# Patient Record
Sex: Male | Born: 1945 | Race: White | Hispanic: No | Marital: Married
Health system: Southern US, Community
[De-identification: ages and names within clinical notes are randomized; demographics above are authoritative.]

## PROBLEM LIST (undated history)

## (undated) DIAGNOSIS — F0390 Unspecified dementia without behavioral disturbance: Secondary | ICD-10-CM

## (undated) DIAGNOSIS — G4733 Obstructive sleep apnea (adult) (pediatric): Secondary | ICD-10-CM

## (undated) DIAGNOSIS — I10 Essential (primary) hypertension: Secondary | ICD-10-CM

## (undated) DIAGNOSIS — I509 Heart failure, unspecified: Secondary | ICD-10-CM

## (undated) DIAGNOSIS — E119 Type 2 diabetes mellitus without complications: Secondary | ICD-10-CM

## (undated) DIAGNOSIS — I2510 Atherosclerotic heart disease of native coronary artery without angina pectoris: Secondary | ICD-10-CM

## (undated) DIAGNOSIS — I5189 Other ill-defined heart diseases: Secondary | ICD-10-CM

## (undated) HISTORY — DX: Unspecified dementia, unspecified severity, without behavioral disturbance, psychotic disturbance, mood disturbance, and anxiety: F03.90

## (undated) HISTORY — DX: Essential (primary) hypertension: I10

## (undated) HISTORY — DX: Other ill-defined heart diseases: I51.89

## (undated) HISTORY — DX: Obstructive sleep apnea (adult) (pediatric): G47.33

## (undated) HISTORY — DX: Atherosclerotic heart disease of native coronary artery without angina pectoris: I25.10

---

## 2012-07-08 DIAGNOSIS — E782 Mixed hyperlipidemia: Secondary | ICD-10-CM | POA: Insufficient documentation

## 2012-07-08 DIAGNOSIS — R079 Chest pain, unspecified: Secondary | ICD-10-CM | POA: Insufficient documentation

## 2012-07-08 DIAGNOSIS — I259 Chronic ischemic heart disease, unspecified: Secondary | ICD-10-CM | POA: Insufficient documentation

## 2012-07-08 DIAGNOSIS — M79609 Pain in unspecified limb: Secondary | ICD-10-CM | POA: Insufficient documentation

## 2012-07-08 DIAGNOSIS — I6529 Occlusion and stenosis of unspecified carotid artery: Secondary | ICD-10-CM | POA: Insufficient documentation

## 2012-07-08 DIAGNOSIS — E669 Obesity, unspecified: Secondary | ICD-10-CM | POA: Insufficient documentation

## 2012-07-08 DIAGNOSIS — I201 Angina pectoris with documented spasm: Secondary | ICD-10-CM | POA: Insufficient documentation

## 2012-12-02 DIAGNOSIS — Z4889 Encounter for other specified surgical aftercare: Secondary | ICD-10-CM | POA: Insufficient documentation

## 2014-09-30 ENCOUNTER — Emergency Department: Payer: Self-pay | Admitting: Emergency Medicine

## 2014-09-30 LAB — COMPREHENSIVE METABOLIC PANEL
ALK PHOS: 89 U/L
Albumin: 3.8 g/dL (ref 3.4–5.0)
Anion Gap: 13 (ref 7–16)
BUN: 19 mg/dL — ABNORMAL HIGH (ref 7–18)
Bilirubin,Total: 0.6 mg/dL (ref 0.2–1.0)
CHLORIDE: 103 mmol/L (ref 98–107)
CREATININE: 1.15 mg/dL (ref 0.60–1.30)
Calcium, Total: 9 mg/dL (ref 8.5–10.1)
Co2: 22 mmol/L (ref 21–32)
EGFR (African American): >60
GLUCOSE: 111 mg/dL — AB (ref 65–99)
OSMOLALITY: 279 (ref 275–301)
Potassium: 4.2 mmol/L (ref 3.5–5.1)
SGOT(AST): 54 U/L — ABNORMAL HIGH (ref 15–37)
SGPT (ALT): 46 U/L
SODIUM: 138 mmol/L (ref 136–145)
Total Protein: 7.4 g/dL (ref 6.4–8.2)

## 2014-09-30 LAB — DIFFERENTIAL
BASOS ABS: 0.1 10*3/uL (ref 0.0–0.1)
BASOS PCT: 0.5 %
EOS ABS: 0 10*3/uL (ref 0.0–0.7)
EOS PCT: 0.1 %
LYMPHS PCT: 7.5 %
Lymphocyte #: 1.3 10*3/uL (ref 1.0–3.6)
MONOS PCT: 7.3 %
Monocyte #: 1.3 x10 3/mm — ABNORMAL HIGH (ref 0.2–1.0)
NEUTROS ABS: 14.8 10*3/uL — AB (ref 1.4–6.5)
Neutrophil %: 84.6 %

## 2014-09-30 LAB — CBC
HCT: 47.2 % (ref 40.0–52.0)
HGB: 15.4 g/dL (ref 13.0–18.0)
MCH: 30.8 pg (ref 26.0–34.0)
MCHC: 32.6 g/dL (ref 32.0–36.0)
MCV: 94 fL (ref 80–100)
PLATELETS: 272 10*3/uL (ref 150–440)
RBC: 5 10*6/uL (ref 4.40–5.90)
RDW: 12.5 % (ref 11.5–14.5)
WBC: 17.5 10*3/uL — AB (ref 3.8–10.6)

## 2014-09-30 LAB — PROTIME-INR
INR: 0.9
Prothrombin Time: 12.5 secs (ref 11.5–14.7)

## 2014-09-30 LAB — ETHANOL

## 2014-09-30 LAB — TROPONIN I: TROPONIN-I: 0.04 ng/mL

## 2014-10-02 ENCOUNTER — Emergency Department: Payer: Self-pay | Admitting: Emergency Medicine

## 2014-10-02 LAB — CBC
HCT: 45.1 % (ref 40.0–52.0)
HGB: 14.9 g/dL (ref 13.0–18.0)
MCH: 31.3 pg (ref 26.0–34.0)
MCHC: 33 g/dL (ref 32.0–36.0)
MCV: 95 fL (ref 80–100)
PLATELETS: 234 10*3/uL (ref 150–440)
RBC: 4.75 10*6/uL (ref 4.40–5.90)
RDW: 12.8 % (ref 11.5–14.5)
WBC: 5.7 10*3/uL (ref 3.8–10.6)

## 2014-10-02 LAB — ACETAMINOPHEN LEVEL: Acetaminophen: <2 ug/mL

## 2014-10-02 LAB — COMPREHENSIVE METABOLIC PANEL
ANION GAP: 11 (ref 7–16)
AST: 50 U/L — AB (ref 15–37)
Albumin: 3.4 g/dL (ref 3.4–5.0)
Alkaline Phosphatase: 81 U/L
BUN: 10 mg/dL (ref 7–18)
Bilirubin,Total: 0.3 mg/dL (ref 0.2–1.0)
CALCIUM: 8.5 mg/dL (ref 8.5–10.1)
CHLORIDE: 104 mmol/L (ref 98–107)
CO2: 25 mmol/L (ref 21–32)
Creatinine: 1.06 mg/dL (ref 0.60–1.30)
EGFR (African American): >60
EGFR (Non-African Amer.): >60
Glucose: 133 mg/dL — ABNORMAL HIGH (ref 65–99)
OSMOLALITY: 280 (ref 275–301)
Potassium: 4 mmol/L (ref 3.5–5.1)
SGPT (ALT): 51 U/L
Sodium: 140 mmol/L (ref 136–145)
Total Protein: 7 g/dL (ref 6.4–8.2)

## 2014-10-02 LAB — ETHANOL

## 2014-10-02 LAB — SALICYLATE LEVEL: Salicylates, Serum: <1.7 mg/dL

## 2014-10-03 LAB — DRUG SCREEN, URINE

## 2014-10-03 LAB — URINALYSIS, COMPLETE
Bacteria: NONE SEEN
Bilirubin,UR: NEGATIVE
Blood: NEGATIVE
Glucose,UR: NEGATIVE mg/dL (ref 0–75)
KETONE: NEGATIVE
Leukocyte Esterase: NEGATIVE
Nitrite: NEGATIVE
PH: 7 (ref 4.5–8.0)
PROTEIN: NEGATIVE
Specific Gravity: 1.011 (ref 1.003–1.030)
Squamous Epithelial: 1

## 2014-10-05 DIAGNOSIS — F29 Unspecified psychosis not due to a substance or known physiological condition: Secondary | ICD-10-CM | POA: Insufficient documentation

## 2014-10-06 DIAGNOSIS — N400 Enlarged prostate without lower urinary tract symptoms: Secondary | ICD-10-CM | POA: Insufficient documentation

## 2014-10-08 DIAGNOSIS — G8929 Other chronic pain: Secondary | ICD-10-CM | POA: Insufficient documentation

## 2014-10-08 DIAGNOSIS — E559 Vitamin D deficiency, unspecified: Secondary | ICD-10-CM | POA: Insufficient documentation

## 2014-10-16 DIAGNOSIS — E538 Deficiency of other specified B group vitamins: Secondary | ICD-10-CM | POA: Insufficient documentation

## 2014-10-27 ENCOUNTER — Emergency Department: Payer: Self-pay | Admitting: Emergency Medicine

## 2014-10-27 LAB — CBC
HCT: 42.4 % (ref 40.0–52.0)
HGB: 14 g/dL (ref 13.0–18.0)
MCH: 31.3 pg (ref 26.0–34.0)
MCHC: 33 g/dL (ref 32.0–36.0)
MCV: 95 fL (ref 80–100)
Platelet: 194 10*3/uL (ref 150–440)
RBC: 4.47 10*6/uL (ref 4.40–5.90)
RDW: 12.8 % (ref 11.5–14.5)
WBC: 6.8 10*3/uL (ref 3.8–10.6)

## 2014-10-27 LAB — COMPREHENSIVE METABOLIC PANEL
ALK PHOS: 74 U/L
Albumin: 3.1 g/dL — ABNORMAL LOW (ref 3.4–5.0)
Anion Gap: 6 — ABNORMAL LOW (ref 7–16)
BILIRUBIN TOTAL: 0.3 mg/dL (ref 0.2–1.0)
BUN: 15 mg/dL (ref 7–18)
CHLORIDE: 105 mmol/L (ref 98–107)
CREATININE: 0.98 mg/dL (ref 0.60–1.30)
Calcium, Total: 8.5 mg/dL (ref 8.5–10.1)
Co2: 28 mmol/L (ref 21–32)
EGFR (Non-African Amer.): >60
Glucose: 172 mg/dL — ABNORMAL HIGH (ref 65–99)
Osmolality: 282 (ref 275–301)
Potassium: 3.8 mmol/L (ref 3.5–5.1)
SGOT(AST): 11 U/L — ABNORMAL LOW (ref 15–37)
SGPT (ALT): 29 U/L
SODIUM: 139 mmol/L (ref 136–145)
TOTAL PROTEIN: 6.2 g/dL — AB (ref 6.4–8.2)

## 2014-10-27 LAB — ACETAMINOPHEN LEVEL: Acetaminophen: <2 ug/mL

## 2014-10-27 LAB — SALICYLATE LEVEL: Salicylates, Serum: <1.7 mg/dL

## 2014-10-27 LAB — ETHANOL: Ethanol: <3 mg/dL

## 2014-10-28 LAB — URINALYSIS, COMPLETE
BACTERIA: NONE SEEN
BLOOD: NEGATIVE
Bilirubin,UR: NEGATIVE
GLUCOSE, UR: NEGATIVE mg/dL (ref 0–75)
Ketone: NEGATIVE
LEUKOCYTE ESTERASE: NEGATIVE
NITRITE: NEGATIVE
Ph: 6 (ref 4.5–8.0)
Protein: NEGATIVE
RBC,UR: 2 /HPF (ref 0–5)
Specific Gravity: 1.021 (ref 1.003–1.030)
Squamous Epithelial: <1
WBC UR: 1 /HPF (ref 0–5)

## 2014-10-28 LAB — DRUG SCREEN, URINE
AMPHETAMINES, UR SCREEN: NEGATIVE (ref ?–1000)
Barbiturates, Ur Screen: NEGATIVE (ref ?–200)
Benzodiazepine, Ur Scrn: NEGATIVE (ref ?–200)
COCAINE METABOLITE, UR ~~LOC~~: NEGATIVE (ref ?–300)
Cannabinoid 50 Ng, Ur ~~LOC~~: NEGATIVE (ref ?–50)
MDMA (ECSTASY) UR SCREEN: NEGATIVE (ref ?–500)
Methadone, Ur Screen: NEGATIVE (ref ?–300)
Opiate, Ur Screen: NEGATIVE (ref ?–300)
PHENCYCLIDINE (PCP) UR S: NEGATIVE (ref ?–25)
Tricyclic, Ur Screen: POSITIVE (ref ?–1000)

## 2014-10-29 LAB — TROPONIN I: Troponin-I: <0.02 ng/mL

## 2014-10-29 LAB — BASIC METABOLIC PANEL
Anion Gap: 8 (ref 7–16)
BUN: 16 mg/dL (ref 7–18)
CREATININE: 1.07 mg/dL (ref 0.60–1.30)
Calcium, Total: 8.5 mg/dL (ref 8.5–10.1)
Chloride: 103 mmol/L (ref 98–107)
Co2: 30 mmol/L (ref 21–32)
EGFR (Non-African Amer.): >60
GLUCOSE: 96 mg/dL (ref 65–99)
Osmolality: 282 (ref 275–301)
Potassium: 3.8 mmol/L (ref 3.5–5.1)
Sodium: 141 mmol/L (ref 136–145)

## 2014-10-29 LAB — PROTIME-INR
INR: 1
PROTHROMBIN TIME: 12.9 s (ref 11.5–14.7)

## 2014-10-29 LAB — CBC
HCT: 43.4 % (ref 40.0–52.0)
HGB: 14.4 g/dL (ref 13.0–18.0)
MCH: 31.5 pg (ref 26.0–34.0)
MCHC: 33.2 g/dL (ref 32.0–36.0)
MCV: 95 fL (ref 80–100)
PLATELETS: 185 10*3/uL (ref 150–440)
RBC: 4.58 10*6/uL (ref 4.40–5.90)
RDW: 12.6 % (ref 11.5–14.5)
WBC: 7.1 10*3/uL (ref 3.8–10.6)

## 2014-10-30 DIAGNOSIS — R079 Chest pain, unspecified: Secondary | ICD-10-CM

## 2014-10-30 LAB — TROPONIN I

## 2014-10-31 LAB — VALPROIC ACID LEVEL: VALPROIC ACID: 22 ug/mL — AB

## 2015-02-03 ENCOUNTER — Inpatient Hospital Stay: Payer: Self-pay | Admitting: Internal Medicine

## 2015-02-03 DIAGNOSIS — I10 Essential (primary) hypertension: Secondary | ICD-10-CM | POA: Diagnosis not present

## 2015-02-03 DIAGNOSIS — R079 Chest pain, unspecified: Secondary | ICD-10-CM | POA: Diagnosis not present

## 2015-02-03 DIAGNOSIS — G4733 Obstructive sleep apnea (adult) (pediatric): Secondary | ICD-10-CM | POA: Diagnosis not present

## 2015-02-03 DIAGNOSIS — I350 Nonrheumatic aortic (valve) stenosis: Secondary | ICD-10-CM | POA: Diagnosis not present

## 2015-02-04 DIAGNOSIS — I509 Heart failure, unspecified: Secondary | ICD-10-CM | POA: Diagnosis not present

## 2015-02-05 ENCOUNTER — Encounter: Payer: Self-pay | Admitting: Physician Assistant

## 2015-02-05 ENCOUNTER — Other Ambulatory Visit: Payer: Self-pay | Admitting: Physician Assistant

## 2015-02-05 DIAGNOSIS — F0390 Unspecified dementia without behavioral disturbance: Secondary | ICD-10-CM | POA: Insufficient documentation

## 2015-02-05 DIAGNOSIS — I2510 Atherosclerotic heart disease of native coronary artery without angina pectoris: Secondary | ICD-10-CM | POA: Insufficient documentation

## 2015-02-05 DIAGNOSIS — I5189 Other ill-defined heart diseases: Secondary | ICD-10-CM | POA: Insufficient documentation

## 2015-02-05 DIAGNOSIS — G4733 Obstructive sleep apnea (adult) (pediatric): Secondary | ICD-10-CM | POA: Insufficient documentation

## 2015-02-05 DIAGNOSIS — I10 Essential (primary) hypertension: Secondary | ICD-10-CM | POA: Insufficient documentation

## 2015-02-06 ENCOUNTER — Emergency Department: Payer: Self-pay | Admitting: Emergency Medicine

## 2015-03-24 NOTE — Consult Note (Signed)
PATIENT NAME:  Thomas Watkins, Thomas Watkins MR#:  [id number] DATE OF BIRTH:  [birth year]  DATE OF CONSULTATION:  10/03/2014  REFERRING PHYSICIAN:   CONSULTING PHYSICIAN:  Audery Amel, MD  IDENTIFYING INFORMATION AND REASON FOR CONSULT: A 69 year old gentleman with an unknown past psychiatric history, who is petitioned here from [employer] with the report that he had been aggressive and assaulted the staff.   CHIEF COMPLAINT: "The police brought me here."   HISTORY OF PRESENT ILLNESS: Information obtained from the patient and from the chart. I attempted to call his wife and I have not gotten a call back yet. We have relatively little outside information to go on. The commitment petition just states that he had been aggressive with staff and also claims that he had been with his wife. The patient, himself, is not by any means a perfect historian. He knows he is currently in an Emergency Room and that the police brought him here, and that they brought him from a retirement community, but he cannot really explain to me why he was at the retirement community. He says he was just taken there previously by the police from [facility]. That raises the question as to how long he had been at [facility], which I am unclear about. The patient denies that he had engaged in any kind of aggressive or assaultive behavior, or that there had been any kind of run-in or unpleasant interaction with anyone whatsoever. Says he has no memory of any of that. He says that his mood is feeling all right. He denies any problems with his sleep. Denies that he is having any hallucinations. He denies suicidal or homicidal ideation. He does say that he had recently been climbing through a barbed wire fence somewhere, but he cannot explain to me why that was happening, at all, and seems puzzled by the whole situation. There is a note among the information sent with him that says that he had a rather recent decline in his mental state,  but the exact history of that is unclear. I can see that he was here in the Emergency Room on Saturday, but there is no physician's note from then. It is not entirely clear what that was all about and he left against medical advice. He is taking medical medication and it looks like he had probably been prescribed some Seroquel recently, although it is not clear how long that has been going on.   PAST PSYCHIATRIC HISTORY: The patient tells me the only time he has ever seen a psychiatrist was at the [facility] where he was evaluated for Agent Orange exposure. He says that he has never been in a psychiatric hospital, never been given a psychiatric diagnosis and never been prescribed any psychiatric medicine as far as he knows.   SOCIAL HISTORY: I am unclear on that. He tells me that he lives with his wife in an area near [place] << MISSING TEXT>>. The fact that he was at [facility] suggests that they had been transferring him to a longer-term rehabilitation or an assisted living facility, but how long he had been there, I do not know.   FAMILY HISTORY: Knows of no family history of mental illness.   PAST MEDICAL HISTORY: The patient says he has "ischemic heart disease." Those are his words. He denies having ever had a heart attack, however. Also says he has high blood pressure. Denies having diabetes. Does not know of any other medical problems.  SUBSTANCE ABUSE HISTORY: Denies that he abuses any other drugs, but admits that he drinks alcohol regularly. Says that he drinks a whiskey and Coke pretty much every day and that has been a long-standing habit, and he has never seen it as being a problem.   CURRENT MEDICATIONS: Based on the reconciliation we have, it is finasteride 5 mg once a day, oxycodone 10 mg every 6 hours as needed for pain, aspirin 325 mg a day, OxyContin 10 mg twice a day, benazepril 20 mg in the morning, isosorbide 120 mg once a day, Nitrostat every 5 minutes as needed  for substernal chest pain, Ranexa 1000 mg twice a day, Crestor 10 mg in the morning, Plavix 75 mg a day, quetiapine 100 mg at night, amlodipine 10 mg in the morning, Lasix 40 mg 3 times a week, ferrous sulfate 325 mg once a day, bisacodyl and MiraLax as needed for constipation, Namenda 7 mg once a day, baclofen 10 mg 3 times a day, lansoprazole 30 mg twice a day.    ALLERGIES: CARBAMAZEPINE, CYMBALTA, (DICTATION ANOMALY) << MISSING TEXT>> LIPITOR, METHOCARBAMOL.   REVIEW OF SYSTEMS: The patient complains of minor pain in his arms from the multiple scratches on them. Has no other current medical complaints. Full 9 category review of systems negative. Also denies any hallucinations. Denies any suicidal or homicidal ideation. Denies feeling depressed.   MENTAL STATUS EXAMINATION: A somewhat disheveled gentleman, who looks his stated age, who is pleasantly cooperative with the interview. Eye contact good. Psychomotor activity a little bit slow, but not much more than the typical person his age. Speech was decreased in total amount, but easy to understand. Affect was pleasant with some jovial laughing along the way that did not seem particularly bizarre or out of context. He seems to be slightly amused by how puzzled he is by his situation. Mood is stated as okay. Denies suicidal or homicidal ideation. Denies any hallucinations. The patient is alert and oriented x 4, and interestingly on Mini-Mental Status examination, scores only a 22 with some of his misses being on rather trivial things like being off by 1 day on the date and day of the week. He is able to draw the figure almost perfectly and overall gives the impression of being not nearly as demented as I would have thought.    LABORATORY RESULTS: His chemistry panel just shows an elevated glucose at 133 on a nonfasting draw, otherwise all normal. CBC is all normal. Drug screen is entirely negative including being negative for opiates. Urinalysis is  negative. No sign of infection. Acetaminophen and salicylates negative.   He had a head CT done when he was here in the Emergency Room on October 31, and it showed patchy supratentorial white matter hypodensities that are less than expected for the patient's age, suggesting sequela of chronic small vessel disease, but nothing else new or remarkable.   VITAL SIGNS: Blood pressure 153/87, respirations 18, pulse 73, temperature 98.6.   ASSESSMENT: A 69 year old man, who presents with supposed aggressive violent behavior, which he is now denying. First guess would be that this was dementia with behavior problems, but his degree of dementia is not particularly severe. The fact that he has carbamazepine listed in his medicines that he is allergic to, as well as Cymbalta, raises for me the question of whether he has been diagnosed with bipolar or another psychiatric disease in the past that he is not aware or not sharing with me. At this point, his  behavior is calm here. We have not contacted [facility] that I know of yet to discuss whether he can go back there.   TREATMENT PLAN: First I would suggest we contact  House and see if, given his current behavior, they would be willing to take him back. If that does not seem to be something they are comfortable with doing, we will try and refer him to a psychiatric hospital. He told me he is a veteran and we [facility]. If that does not seem to be working out, we could consider possible admission here to the hospital. If it were just a matter of dementia, I would say he certainly does not need to come into our hospital, but the possibility that this could be bipolar disorder leaves that as an open possibility. I have left a phone message for his wife to call back to the ER. For now, I am going to continue medicines for his cardiac condition, but probably minimize any of the narcotics or other things that could cause mental status  changes.   DIAGNOSIS, PRINCIPAL AND PRIMARY:  AXIS I: Dementia with behavior changes.   SECONDARY DIAGNOSES:  AXIS I: Rule out bipolar disorder, high blood pressure, history of coronary artery disease.    ____________________________ Audery AmelJohn T. Zakhi Dupre, MD jtc:JT D: 10/03/2014 10:45:22 ET T: 10/03/2014 11:10:50 ET JOB#: [id number]  cc: Audery AmelJohn T. Belal Scallon, MD, <Dictator> Audery AmelJOHN T Jenalee Trevizo MD ELECTRONICALLY SIGNED 10/07/2014 14:58

## 2015-03-24 NOTE — Consult Note (Signed)
PATIENT NAME:  Thomas Watkins, Thomas Watkins MR#:  [id number] DATE OF BIRTH:  Apr 30, 1946  DATE OF CONSULTATION:  10/29/2014  CONSULTING PHYSICIAN:  Makeila Yamaguchi J. Cherlynn Kaiser, MD  REQUESTING PHYSICIAN: Sheran Fava. Fanny Bien, MD   PRIMARY CARE PHYSICIAN: Nonlocal.   REASON FOR CONSULTATION: Chest pain.   HISTORY OF PRESENT ILLNESS: This is a 69 year old male who presented to the ER 2 days ago due to suicidal ideations. He is under involuntary commitment and is awaiting placement to a geripsychiatric unit. While in the ER, the patient started having some substernal chest pain along with some subtle EKG changes and hospitalist services were contacted for further evaluation. The patient describes the chest pain as being not in the center of his chest, nonradiating, not associated with any shortness of breath, nausea, vomiting, diaphoresis, palpitations or any other associated symptoms. The patient received some aspirin and some sublingual nitroglycerin and his pain since then has improved and now resolved. His EKG did show T wave inversions in the lateral leads and hospitalist services were contacted for consultation.   REVIEW OF SYSTEMS:  CONSTITUTIONAL: No documented fever. No weight gain or weight loss.   EYES: No blurred or double vision.   ENT: No tinnitus. No postnasal drip. No redness of the oropharynx.   RESPIRATORY: No cough, no wheeze, no hemoptysis, no dyspnea.   CARDIOVASCULAR: Positive chest pain. No orthopnea. No palpitations. No syncope.   GASTROINTESTINAL: No nausea, vomiting, diarrhea. No abdominal pain. No melena or hematochezia.   GENITOURINARY: No dysuria or hematuria.   ENDOCRINE: No polyuria, nocturia, heat or cold intolerance.   HEMATOLOGIC: No anemia, no bruising, no bleeding.   INTEGUMENTARY: No rashes or lesions.   MUSCULOSKELETAL: No arthritis. No swelling. No gout.   NEUROLOGIC: No numbness, no tingling, no ataxia. No seizure activity. Positive Lewy-Body dementia.  PSYCHIATRIC: No  anxiety, no insomnia. No ADD.   PAST MEDICAL HISTORY: Consistent with a history of coronary artery disease status post bypass, history of Lewy-Body dementia, hypertension, BPH, hyperlipidemia, history of CHF.   ALLERGIES: ARE TO MULTIPLE DRUGS INCLUDING ZANAFLEX, CARBAMAZEPINE, CYMBALTA, LIPITOR, METHOCARBAMOL, OXCARBAZEPINE, TEGRETOL.   SOCIAL HISTORY: No smoking. No alcohol abuse. No illicit drug abuse. Was residing [address] prior to coming to the ER.   FAMILY HISTORY: The patient's mother died from complications of heart disease. He cannot recall what his father died from.   CURRENT MEDICATIONS: Are as follows: Norvasc 10 mg daily, aspirin 325 mg daily, baclofen 10 mg 3 times a day, benazepril 20 mg daily, Dulcolax 5 mg 2 tablets daily, Plavix 75 mg daily, Crestor 10 mg daily, Depakote 500 mg 2 tabs daily, Colace 100 mg daily, Exelon patch 4.6 mg daily, iron sulfate 325 mg daily, finasteride 5 mg daily, Lasix 40 mg Monday, Wednesday and Friday, Imdur 20 mg daily, lansoprazole 30 mg b.i.d., lidocaine topical as needed, lorazepam 1 mg every 4 hours as needed, Tylenol 650 every 8 hours as needed, Toprol 25 mg daily, sublingual nitroglycerin as needed, MiraLax daily as needed, Prazosin 2 mg daily, Seroquel 300 mg at bedtime, Seroquel 50 mg every 4 hours as needed, Ranexa 1000 mg b.i.d., trazodone 50 mg at bedtime as needed and vitamin D3 - 2000 international units daily.   PHYSICAL EXAMINATION: Presently is as follows:  VITAL SIGNS: Are noted to be temperature 98.2, pulse 69, respirations 16, blood pressure 115/67, oxygen saturations 97% on room air.   GENERAL: He is a pleasant-appearing male in no apparent distress.   HEAD, EYES, EARS, NOSE AND THROAT:  Atraumatic, normocephalic. Extraocular muscles are intact. Pupils equal and reactive to light. Sclerae anicteric. No conjunctival injection. No pharyngeal erythema.   NECK: Supple. There is no jugular venous distention. No bruits. No  lymphadenopathy or thyromegaly.   HEART: Regular rate and rhythm. A 2/6 systolic ejection murmur heard at the base. No rubs, no clicks.   LUNGS: Clear to auscultation bilaterally. No rales, rhonchi, no wheezes.   ABDOMEN: Soft, flat, nontender, nondistended. Has good bowel sounds. No hepatosplenomegaly appreciated.   EXTREMITIES: No evidence of any cyanosis, clubbing or peripheral edema. Has +2 pedal and radial pulses bilaterally.   NEUROLOGICAL: The patient is alert, awake and oriented x 3 with no focal motor or sensory deficits appreciated bilaterally.   SKIN: Moist and warm with no rashes appreciated.   LYMPHATIC: There is no cervical or axillary lymphadenopathy.   LABORATORY DATA: Serum glucose 96, BUN 16, creatinine 1.07, sodium 141, potassium 3.8, chloride 103, bicarbonate 30. Troponin less than 0.02. White cell count 7.1, hemoglobin 14.4, hematocrit 43.4, platelet count of 185,000.   ASSESSMENT AND PLAN: This is a 69 year old male with a history of coronary artery disease status post bypass, history of Lewy-Body dementia, hypertension, benign prostatic hypertrophy, hyperlipidemia, history of congestive heart failure who presented to the emergency room due to suicidal ideations and is awaiting a geripsychiatric unit placement. While in the emergency room, the patient developed chest pain with T wave inversion in the lateral leads and hospitalist services were consulted.  1. Chest pain with EKG changes. The patient does have significant risk factors for coronary artery disease given his history of bypass and cardiac stents. For now, he will be placed on telemetry. We will follow his serial enzymes x 2. Will continue his aspirin, Plavix, beta blocker, statin, Imdur and Ranexa. If needed, would consult cardiology. We will order a stress test in the morning.  2. Hypertension, presently hemodynamically stable. Continue metoprolol, benazepril, Norvasc and Imdur.  3. Hyperlipidemia. Continue  Crestor.  4. Benign prostatic hypertrophy. Continue Proscar.  5. History of Lewy-Body dementia. Continue with his Seroquel, rivastigmine and Depakote. The patient is awaiting geripsychiatric placement. 6. The patient is a full code.   Thank you so much for the consultation. We will follow along with you.   TIME SPENT ON CONSULT: 50 minutes.     ____________________________ Rolly PancakeVivek J. Cherlynn KaiserSainani, MD vjs:TT D: 10/29/2014 16:12:40 ET T: 10/29/2014 19:07:31 ET JOB#: [id number]  cc: Rolly PancakeVivek J. Cherlynn KaiserSainani, MD, <Dictator> Houston SirenVIVEK J Cecilie Heidel MD ELECTRONICALLY SIGNED 11/04/2014 15:56

## 2015-03-24 NOTE — Consult Note (Signed)
Psychiatry: PAtient seen and chart reviewed. PAtient now engaging in particularly repulsive behavior this evening involving intentional stooling. We had thought to be able to return him to his facility tomorrow but this does not bose well. Patient appears more psychotic than simply demented. Has labile. odd affect and no rational explanation for what seems clearly intentional. Will increase seroquel 200mg  bid and add standing imodium which may help decrease the frequency of his stooling.psychosis nos, ro dementia with psychosis. Possible Lewy body but not possible to prove and he does not appear particularly stiff.  Electronic Signatures: Audery Amellapacs, John T (MD)  (Signed on 04-Nov-15 22:31)  Authored  Last Updated: 04-Nov-15 22:31 by Audery Amellapacs, John T (MD)

## 2015-03-24 NOTE — Consult Note (Signed)
Psychiatry: [facility]. Elderly man with dementia and recent behavior problems and hallucinations. Currently has no complaint. Denies any SI or depression. Patient is lying down in his room. Affect calm and smiling. Mood "ok".  is [facility]. Continue current medication and treatment in the ER.   Electronic Signatures: Clapacs, Jackquline DenmarkJohn T (MD)  (Signed on 30-Nov-15 21:52)  Authored  Last Updated: 30-Nov-15 21:52 by Audery Amellapacs, John T (MD)

## 2015-03-24 NOTE — Consult Note (Signed)
Brief Consult Note: Diagnosis: 1. Chest Pain w/ ECG changes 2. hx of Lewy body dementia 3. HTN 4. BPH 5. Hyperlipidemia.   Patient was seen by consultant.   Consult note dictated.   Orders entered.   Discussed with Attending MD.   Comments: 69 yo male w/ hx of CAD s/p CABG, hx of Lewy body dementia, HTN, BPH, Hyperlipidemia, hx of CHF who presented to the ER due to suicidal ideations and is awaiting Geri-Psych unit placement. While in the ER pt. developed Chest pain w/ ECG changes and hospitalist services were consulted.    1. Chest Pain w/ECG changes - pt. does have risk factors for CAD given hx of CABG and stents.   - tele, serial enzymes (1st set negative) - cont. ASA, Plavix, B-blocker, Statin, Imdur, Ranexa.  - will get Myoview in a.m.   2. HTN - cont. Metoprolol, Benazapril, Norvasc, Imdur  3. Hyperlipidemia - cont. Crestor.   4. Hx of BPH - cont. Proscar.   5. Lewy body dementia - cont. Seroquel, Rivastigmine, Depakote.  - await Geri-Psych placement.   Full Code  thanks for the consult and will follow with you.   JOb # [id number].  Electronic Signatures: Houston SirenSainani, Vivek J (MD)  (Signed ([phone]-Nov-15 16:12)  Authored: Brief Consult Note   Last Updated: 29-Nov-15 16:12 by Houston SirenSainani, Vivek J (MD)

## 2015-04-01 NOTE — Discharge Summary (Signed)
PATIENT NAME:  Thomas DestineFORBES, Bonham G MR#:  [id number] DATE OF BIRTH:  1946/03/28  DATE OF ADMISSION:  02/03/2015 DATE OF DISCHARGE:  02/05/2015   PRESENTING COMPLAINT:  Chest pain   DISCHARGE DIAGNOSES:  1. Unstable angina.  2. Coronary artery disease. 3. Congestive heart failure, well compensated.  4. Obstructive sleep apnea. 5. History of dementia.  6. Back pain. 7. Hypertension.   CONDITION ON DISCHARGE: Fair.  VITAL SIGNS: Stable.   CARDIOLOGY CONSULTATION: Antonieta Ibaimothy J. Gollan, MD   PROCEDURES: Cardiac catheterization showed chronic changes. No intervention needed. It showed occluded mid LAD patent and mid to distal LAD up to 100% occlusion with Free LIMA to mid LAD patent mid LAD stent with mild ISR. Patent proximal to mid LAD stent with full 100% occlusion. Left circumflex and RCA with mild diffuse disease. Normal EF at more than 55%.   CODE STATUS: Full code.   DISPOSITION: The patient will discharge back to [facility].  DISCHARGE MEDICATIONS:  1. Aspirin 81 mg p.o. daily.  2. Plavix  75 mg daily. 3. Donepezil 5 mg p.o. at bedtime. 4. Hydrocerin apply to affected area 3 times a day.  5. Imdur 120 mg p.o. daily extended release. 6. Lisinopril 10 mg daily. 7. Melatonin 3 mg at bedtime.  8. Metoprolol 25 mg extended release p.o. daily. 9. Prazosin 2 mg at bedtime. 10. Crestor 10 mg at bedtime. 11. Hydroxyzine hydrochloride 25 mg 1 tablet 3 times a day as needed. 12. Nitroglycerin 0.4 mg sublingual as needed. 13. Senna 1 tablet b.i.d. as needed. 14. Docusate 100 mg b.i.d.   FOLLOWUP CARE:  1. Follow up with your primary cardiologist in the [place] area or follow up with Dr. Mariah MillingGollan in 2 weeks.  2. Follow up with your primary care physician.   LABORATORY DATA AT DISCHARGE: CBC within normal limits. Cardiac enzymes x 3 are negative. Creatinine 0.93.   BRIEF SUMMARY AND HOSPITAL COURSE: [name] is a 69 year old Caucasian gentleman with history of  coronary artery disease, comes in with: 1. Unstable angina. He has a history of coronary artery disease with coronary artery bypass grafting and stent in the past. His cardiologist is in the [place] area. The patient was placed in the intensive care unit, started on nitroglycerin drip, which was weaned off once his chest pain resolved, and he was continued on heparin drip. He was seen by cardiologist over the weekend, who recommended cardiac catheterization. Results as above were noted. Per Dr. Mariah MillingGollan, no further intervention recommended.  Continue all of his cardiac medications, which have been listed as above. 2. Coronary artery disease, stable. 3. Hypertension. Continue Imdur, ACE, and beta blockers. 4. Hyperlipidemia, on Crestor. 5. Dementia, on donepezil.  Hospital stay otherwise remained stable. The patient remained a full code.  TIME SPENT: 40 minutes. 02/05/2015   ____________________________ Wylie HailSona A. Allena KatzPatel, MD sap:mw D: 02/05/2015 11:35:00 ET T: 02/05/2015 12:49:06 ET JOB#: [id number]  cc: Norvell Ureste A. Allena KatzPatel, MD, <Dictator> Willow OraSONA A Renleigh Ouellet MD ELECTRONICALLY SIGNED 02/10/2015 14:27

## 2015-04-01 NOTE — Consult Note (Signed)
General Aspect 69 yo man with hx of CAD, dementia admitted from the nursing home with symptoms of unstable angina,   Present Illness Pt was admitted yesterday with symptoms of unstable angina.  he has a hx of CAD / CABG and multiple stents.  his wife has stated that he has had negative stress tests but then is found to have significant coronary artery disease requiring stenting.  he had substernal chest pain, tightness yesterday.  no NTG was available at the nursing home.  had pain for several hours until he arrived here.  was started in IV NTG and heparin and now is very stable. pain ws 10/10,  associated with some shortness of breath.   very similar to his previous epidoses of CP  former smoker + family hx of CAD - brother.   previously saw a cardiologist in [place].  had a stent placed last year.  has reportedly done well since that time.   Physical Exam:  GEN well developed, well nourished, obese   HEENT pink conjunctivae, hearing intact to voice, moist oral mucosa   NECK supple  No masses  thyroid not tender   RESP normal resp effort  clear BS  no use of accessory muscles   CARD Regular rate and rhythm  Normal, S1, S2  No murmur   ABD denies tenderness  soft   LYMPH negative neck   EXTR negative cyanosis/clubbing, negative edema, good plulses   SKIN normal to palpation, No rashes   NEURO cranial nerves intact, follows commands, motor/sensory function intact   Review of Systems:  Subjective/Chief Complaint chest tightness   General: No Complaints  pain free this am   Skin: No Complaints   ENT: No Complaints   Eyes: No Complaints   Neck: No Complaints   Respiratory: No Complaints   Cardiovascular: Chest pain or discomfort  yesterday.  better today   Gastrointestinal: No Complaints   Genitourinary: No Complaints   Vascular: No Complaints   Musculoskeletal: No Complaints   Neurologic: No Complaints   Hematologic: No Complaints   Endocrine: No  Complaints   Family & Social History:  Family and Social History:  Family History Coronary Artery Disease  in his brother   Social History former smoker   Scientist, research (physical sciences) of [facility]     Uvulaplapapotomy:    GERD - Esophageal Reflux:    CHF:    Coronary Artery Disease:    Sleep Apnea:    Chronic Back Pain:    Dementia:    Stent, Cardiac:    Myocardial Infarct:    Hypertension:    Knee Surgery - Right Knee replacement:    Spinal Cord Stimulator:    Back Surgery:   Home Medications: Medication Instructions Status  aspirin 81 mg oral tablet 1 tab(s) orally once a day Active  clopidogrel 75 mg oral tablet 1 tab(s) orally once a day Active  donepezil 5 mg oral tablet 1 tab(s) orally once a day (at bedtime) Active  Hydrocerin - topical cream Apply topically to affected area 3 times a day Active  isosorbide mononitrate 120 mg oral tablet, extended release 1 tab(s) orally once a day (in the morning) Active  lisinopril 10 mg oral tablet 1 tab(s) orally once a day Active  Melatonin 3 mg oral tablet 1 tab(s) orally once (at bedtime) Active  metoprolol succinate 25 mg oral tablet, extended release 1 tab(s) orally once a day Active  prazosin 2 mg oral capsule 1 cap(s) orally once a day (  at bedtime) Active  rosuvastatin 10 mg oral tablet 1 tab(s) orally once a day (at bedtime) Active  hydrOXYzine hydrochloride 25 mg oral tablet 1 tab(s) orally 3 times a day, As Needed Active   EKG:  Additional Comments tele, NSR , no ST or T wave changes.    Zanaflex: Other  Lipitor: Unknown  Cymbalta: Unknown  Tegretol: Unknown  Methocarbamol: Unknown  oxcarbazepine: Unknown  Carbamazepine: Unknown  Lecithin: Unknown  Yellow Dye #5: Unknown  Vital Signs/Nurse's Notes: **Vital Signs.:   05-Mar-16 02:21  Temperature Temperature (F) 98.4  Celsius 36.8  Pulse Pulse 69  Respirations Respirations 18  Systolic BP Systolic BP 129  Diastolic BP (mmHg) Diastolic BP (mmHg) 74  Mean  BP 92  Pulse Ox % Pulse Ox % 95  Pulse Ox Activity Level  At rest  Oxygen Delivery Room Air/ 21 %    02:30  Pulse Pulse 69  Respirations Respirations 17  Systolic BP Systolic BP 124  Diastolic BP (mmHg) Diastolic BP (mmHg) 65  Mean BP 84  Pulse Ox % Pulse Ox % 94  Pulse Ox Activity Level  At rest  Oxygen Delivery Room Air/ 21 %    02:45  Pulse Pulse 70  Respirations Respirations 16  Systolic BP Systolic BP 128  Diastolic BP (mmHg) Diastolic BP (mmHg) 71  Mean BP 90  Pulse Ox % Pulse Ox % 96  Pulse Ox Activity Level  At rest  Oxygen Delivery Room Air/ 21 %    03:00  Pulse Pulse 68  Respirations Respirations 16  Systolic BP Systolic BP 121  Diastolic BP (mmHg) Diastolic BP (mmHg) 66  Mean BP 84  Pulse Ox % Pulse Ox % 95  Pulse Ox Activity Level  At rest  Oxygen Delivery Room Air/ 21 %    03:15  Pulse Pulse 74  Respirations Respirations 17  Systolic BP Systolic BP 126  Diastolic BP (mmHg) Diastolic BP (mmHg) 67  Mean BP 86  Pulse Ox % Pulse Ox % 94  Pulse Ox Activity Level  At rest  Oxygen Delivery Room Air/ 21 %    03:30  Pulse Pulse 71  Respirations Respirations 17  Systolic BP Systolic BP 123  Diastolic BP (mmHg) Diastolic BP (mmHg) 75  Mean BP 91  Pulse Ox % Pulse Ox % 95  Pulse Ox Activity Level  At rest  Oxygen Delivery Room Air/ 21 %    03:45  Pulse Pulse 72  Respirations Respirations 16  Systolic BP Systolic BP 93  Diastolic BP (mmHg) Diastolic BP (mmHg) 52  Mean BP 65  Pulse Ox % Pulse Ox % 91  Pulse Ox Activity Level  At rest  Oxygen Delivery Room Air/ 21 %    04:00  Pulse Pulse 71  Respirations Respirations 17  Systolic BP Systolic BP 88  Diastolic BP (mmHg) Diastolic BP (mmHg) 50  Mean BP 62  Pulse Ox % Pulse Ox % 92  Pulse Ox Activity Level  At rest  Oxygen Delivery Room Air/ 21 %    04:15  Pulse Pulse 71  Respirations Respirations 18  Systolic BP Systolic BP 91  Diastolic BP (mmHg) Diastolic BP (mmHg) 48  Mean BP 62  Pulse Ox % Pulse  Ox % 93  Pulse Ox Activity Level  At rest  Oxygen Delivery Room Air/ 21 %    04:30  Pulse Pulse 70  Respirations Respirations 16  Systolic BP Systolic BP 97  Diastolic BP (mmHg) Diastolic BP (mmHg) 53  Mean BP 67  Pulse Ox % Pulse Ox % 16  Pulse Ox Activity Level  At rest  Oxygen Delivery Room Air/ 21 %    04:45  Pulse Pulse 71  Respirations Respirations 18  Systolic BP Systolic BP 129  Diastolic BP (mmHg) Diastolic BP (mmHg) 77  Mean BP 94  Pulse Ox % Pulse Ox % 90  Pulse Ox Activity Level  At rest  Oxygen Delivery Room Air/ 21 %    05:00  Pulse Pulse 69  Respirations Respirations 17  Systolic BP Systolic BP 123  Diastolic BP (mmHg) Diastolic BP (mmHg) 71  Mean BP 88  Pulse Ox % Pulse Ox % 95  Pulse Ox Activity Level  At rest  Oxygen Delivery Room Air/ 21 %    05:15  Pulse Pulse 79  Respirations Respirations 18  Systolic BP Systolic BP 136  Diastolic BP (mmHg) Diastolic BP (mmHg) 74  Mean BP 94  Pulse Ox % Pulse Ox % 95  Pulse Ox Activity Level  At rest  Oxygen Delivery Room Air/ 21 %    05:30  Pulse Pulse 68  Respirations Respirations 15  Systolic BP Systolic BP 121  Diastolic BP (mmHg) Diastolic BP (mmHg) 73  Mean BP 89  Pulse Ox % Pulse Ox % 95  Pulse Ox Activity Level  At rest  Oxygen Delivery Room Air/ 21 %    05:45  Pulse Pulse 70  Respirations Respirations 15  Systolic BP Systolic BP 120  Diastolic BP (mmHg) Diastolic BP (mmHg) 70  Mean BP 86  Pulse Ox % Pulse Ox % 93  Pulse Ox Activity Level  At rest  Oxygen Delivery Room Air/ 21 %    06:30  Pulse Pulse 73  Respirations Respirations 17  Systolic BP Systolic BP 131  Diastolic BP (mmHg) Diastolic BP (mmHg) 75  Mean BP 93  Pulse Ox % Pulse Ox % 95  Pulse Ox Activity Level  At rest  Oxygen Delivery Room Air/ 21 %    06:45  Pulse Pulse 74  Respirations Respirations 18  Systolic BP Systolic BP 132  Diastolic BP (mmHg) Diastolic BP (mmHg) 71  Mean BP 91  Pulse Ox % Pulse Ox % 94  Pulse Ox  Activity Level  At rest  Oxygen Delivery Room Air/ 21 %    07:35  Vital Signs Type stepdown  Temperature Temperature (F) 97.8  Celsius 36.5  Temperature Source oral  Pulse Pulse 73  Pulse source if not from Vital Sign Device per cardiac monitor  Respirations Respirations 15  Systolic BP Systolic BP 110  Diastolic BP (mmHg) Diastolic BP (mmHg) 54  Mean BP 72  BP Source  if not from Vital Sign Device non-invasive  Pulse Ox % Pulse Ox % 93  Pulse Ox Activity Level  At rest  Oxygen Delivery Room Air/ 21 %    08:13  Pulse Pulse 72  Respirations Respirations 18  Systolic BP Systolic BP 146  Diastolic BP (mmHg) Diastolic BP (mmHg) 74  Mean BP 98  Pulse Ox % Pulse Ox % 95  Pulse Ox Activity Level  At rest  Oxygen Delivery Room Air/ 21 %    09:17  Pulse Pulse 62  Respirations Respirations 14  Systolic BP Systolic BP 105  Diastolic BP (mmHg) Diastolic BP (mmHg) 87  Mean BP 93  Pulse Ox % Pulse Ox % 94  Pulse Ox Activity Level  At rest  Oxygen Delivery Room Air/ 21 %  Impression Mr. Shean is a 69 yo with hx of CAD, CABG and miltiple stent procedures.  he was admitted with symptoms of unstable angina. ECG on admission shows NSR with TWI in the lateral leads.  he is currently on heparn and NTG and reported to me that he was not having any chest pain.  will get another ECG and will continue to check Troponin levels.   His wife has stated on several occasions that he has had several stress tests in the past that were negative but then was found to have significant CAD requiring PCI.  I think that our best strategy is to plan on doing a cath Monday am.  2. Hx of CHF:  type not specified.  will get an echo today.   3.  Obstructive sleep apnea:  he is sleeping with out any respiratory difficulty this am.  he may need CPAP at night.  will leave up to Int. Med.   4. Dementia:  he was able to talk with me today.    5. Essential HTN:  BP is currently well controlled.  continue current  meds.   Electronic Signatures: Nahser, Antony Blackbird (MD)  (Signed [phone] 09:45)  Authored: General Aspect/Present Illness, History and Physical Exam, Review of System, Family & Social History, Past Medical History, Home Medications, EKG , Allergies, Vital Signs/Nurse's Notes, Impression/Plan   Last Updated: 05-Mar-16 09:45 by Nahser, Antony Blackbird (MD)

## 2015-04-01 NOTE — H&P (Signed)
PATIENT NAME:  Alba DestineFORBES, Ryleigh G MR#:  [id number] DATE OF BIRTH:  1946-01-07  DATE OF ADMISSION:  02/03/2015  CHIEF COMPLAINT: Chest pain.   HISTORY OF PRESENT ILLNESS: This is a 69 year old gentleman who presents to the ED tonight with complaint of chest pain that began at about 9:00 p.m. on 02/02/2015. This patient states that his pain is substernal. It is a squeezing pain with a sort of waxing and waning exacerbation. He also has pain to his left arm and also down his left leg. The patient suffers from dementia and typically resides at [facility] in the memory unit. The patient's wife is present with him in the ED today, and states that the patient was having chest pain and was walking to notify the staff at the facility of his chest pain when he had a syncopal episode. The patient was then brought to the ED. The patient got some sublingual nitroglycerin by EMS en route, and then 3 sublingual nitroglycerin is here in the ED, which briefly helped his pain, but then the pain returns. The patient endorses associated nausea. Denies vision changes, diaphoresis, abdominal pain, vomiting. When talking with the patient and his wife, the patient's wife states that this is a patient who has significant history of CAD with prior CABG and also 5 PCI procedures with 8 total stents placed. His wife is very emphatic in stating that all of this was in light of repeated negative stress tests, but whenever catheterization was done Erie Noeje was found to have significant disease on each of these attempts. Per the patient's wife his doctor is Dr. Abran CantorJacoby,  cardiologist in [facility], who in the past has told him that he is an atypical case of CAD and has in the past just taken him straight to catheterization on suspicion from clinical symptoms and found disease and placed some of these stents. In the ED, the patient was found to have an EKG, which had some T wave inversions in the lateral leads, but no other  ischemic findings. He was also found to have negative enzymes, first set. The wife states that all this is consistent with his prior episodes. After that, hospitalists were called for admission for rule out ACS.   PRIMARY CARE PHYSICIAN: Nonlocal.   CARDIOLOGIST: Dr. Abran CantorJacoby, as noted above. The patient has phone listed for this cardiologist as [phone].   PAST MEDICAL HISTORY: Includes CAD, CHF, OSA, GERD, back pain, dementia, hypertension.   MEDICATIONS: Rosuvastatin 10 mg daily, prazosin 2 mg daily, Toprol-XL 25 mg daily, melatonin 3 mg at bedtime as needed, lisinopril 10 mg daily, Imdur ER 120 mg daily, hydroxyzine 25 mg t.i.d. p.r.n., donepezil 5 mg daily, Plavix 75 mg daily, aspirin 81 mg daily.   PRIOR SURGICAL HISTORY: Includes back surgery, multiple stent placements in his coronary arteries, right total knee replacement, left total knee replacement, CABG, and uvula pulpotomy.   ALLERGIES: ZANAFLEX, LIPITOR, CYMBALTA, TEGRETOL, METHOCARBAMOL, OXCARBAZEPINE, CARBAMAZEPINE, LECITHIN, YELLOW DYE #5.   FAMILY HISTORY: CAD, diabetes mellitus, cancer.   SOCIAL HISTORY: Nonsmoker, occasional social drinker. Denies illicit drug use.   REVIEW OF SYSTEMS:  CONSTITUTIONAL: Denies fever, fatigue, weakness.  EYES: Denies blurred or double vision, pain or redness.  EAR, NOSE, AND THROAT: Denies ear pain, hearing loss, or difficulty swallowing.  RESPIRATORY: Denies cough, wheeze, hemoptysis.  CARDIOVASCULAR: Endorses chest pain. Denies palpitations or dyspnea on exertion.  GASTROINTESTINAL: Denies nausea, vomiting, diarrhea, abdominal pain, constipation.  GENITOURINARY: Denies dysuria, hematuria, or frequency.  ENDOCRINE:  Denies nocturia or thyroid problems, heat or cold intolerance.  HEMATOLOGIC AND LYMPHATIC: Denies easy bruising, bleeding, swollen glands.  INTEGUMENTARY: Denies acne, rash or lesions.  MUSCULOSKELETAL: Denies arthritis, swelling, gout.  NEUROLOGICAL: Denies numbness,  weakness, headache.  PSYCHIATRIC: Denies anxiety, insomnia, depression.   PHYSICAL EXAMINATION:  VITAL SIGNS: Blood pressure 129/80, pulse 78, temperature 98.4, respirations 18, with 96% oxygen saturation on room air.  GENERAL: This is a well-nourished elderly gentleman lying supine in bed, appearing very uncomfortable holding his chest.  HEENT: Pupils equal, round, and reactive to light and accommodation. Extraocular movements intact. No scleral icterus. Moist mucosal membranes.  NECK: Thyroid is normal and not enlarged. Neck is supple with no masses and nontender. No cervical lymphadenopathy. No JVD noted.  RESPIRATORY: Clear to auscultation bilaterally with no rales, rhonchi, or wheezing. The patient is not in respiratory distress.  CARDIOVASCULAR: Regular rate and rhythm. The patient has a 3/6 systolic murmur heard loudest at the right upper sternal border. The patient has good pedal pulses without lower extremity do.  ABDOMEN: Soft, nontender, nondistended with good bowel sounds. No hepatosplenomegaly.  MUSCULOSKELETAL: Muscular strength 5/5 throughout all 4 extremities. Full spontaneous range of motion throughout with no distal cyanosis or clubbing.  SKIN: No rash or lesions noted. Skin is warm, dry, and intact  LYMPHATIC: No adenopathy.  NEUROLOGIC: Cranial nerves intact. Sensation throughout intact without dysarthria or aphasia.  PSYCHIATRIC: The patient is alert and oriented x 3, cooperative with good insight and judgment.   LABORATORY DATA: White count 9.5, hemoglobin 15.7, hematocrit 46.5, platelets 195,000. Sodium 143, potassium 4.0, chloride 108, bicarbonate 25, BUN 21, creatinine 0.96. Troponin less than 0.02. BNP mildly elevated at 169. Glucose 125. Chest x-ray showed mild bibasilar atelectasis noted in lung fields, otherwise clear.    ASSESSMENT AND PLAN:  1.  Unstable angina: The patient does not have extremely convincing EKG findings or elevated troponins for this at this time;  however, his history is very consistent with that presented by his wife for unstable angina. The patient's chest pain has not been resolved with treatment already given. We will start nitroglycerin and a heparin drip, trend his cardiac enzymes, get an echocardiogram, and a cardiology consult.  2.  Coronary artery disease: This patient is on a number of appropriate medications for coronary artery disease, including beta blocker and statin. Continue these medication while inpatient. 3.  HTN: Controlled at this time, continue home medications and IV meds if needed to keep BP under good control in setting of unstable angina. 4.  Chronic Congestive Heart Failure: most certainly due to CAD.  Continue appropriate heart failure meds.   5.  Dementia: stable, continue donepezil here.  DVT prophylaxis: patient is on heparin anticoagulation perACS protocol for unstable angina.  This patient is Full Code  Time spent on this admission: 45 minutes     ____________________________ Candace Cruise. Anne Hahn, MD dfw:bm D: 02/03/2015 03:36:33 ET T: 02/03/2015 04:17:57 ET JOB#: [id number]  cc: Candace Cruise. Anne Hahn, MD, <Dictator> Amiyrah Lamere Scotty Court MD ELECTRONICALLY SIGNED 02/03/2015 20:39

## 2016-05-20 ENCOUNTER — Inpatient Hospital Stay (HOSPITAL_COMMUNITY)
Admit: 2016-05-20 | Discharge: 2016-05-20 | Disposition: A | Discharge status: Home / Self Care | Payer: Non-veteran care | Attending: Internal Medicine | Admitting: Internal Medicine

## 2016-05-20 ENCOUNTER — Encounter: Payer: Self-pay | Admitting: Emergency Medicine

## 2016-05-20 ENCOUNTER — Inpatient Hospital Stay
Admission: EM | Admit: 2016-05-20 | Discharge: 2016-05-23 | DRG: 308 | Disposition: A | Discharge status: Home Health | Payer: Non-veteran care | Attending: Internal Medicine | Admitting: Internal Medicine

## 2016-05-20 ENCOUNTER — Other Ambulatory Visit: Payer: Self-pay

## 2016-05-20 ENCOUNTER — Emergency Department: Payer: Non-veteran care

## 2016-05-20 DIAGNOSIS — Z87891 Personal history of nicotine dependence: Secondary | ICD-10-CM

## 2016-05-20 DIAGNOSIS — I2510 Atherosclerotic heart disease of native coronary artery without angina pectoris: Secondary | ICD-10-CM | POA: Diagnosis present

## 2016-05-20 DIAGNOSIS — I4892 Unspecified atrial flutter: Secondary | ICD-10-CM | POA: Diagnosis present

## 2016-05-20 DIAGNOSIS — Z951 Presence of aortocoronary bypass graft: Secondary | ICD-10-CM

## 2016-05-20 DIAGNOSIS — R918 Other nonspecific abnormal finding of lung field: Secondary | ICD-10-CM | POA: Diagnosis present

## 2016-05-20 DIAGNOSIS — J9601 Acute respiratory failure with hypoxia: Secondary | ICD-10-CM | POA: Diagnosis present

## 2016-05-20 DIAGNOSIS — J209 Acute bronchitis, unspecified: Secondary | ICD-10-CM | POA: Diagnosis present

## 2016-05-20 DIAGNOSIS — I480 Paroxysmal atrial fibrillation: Principal | ICD-10-CM | POA: Diagnosis present

## 2016-05-20 DIAGNOSIS — I4891 Unspecified atrial fibrillation: Secondary | ICD-10-CM | POA: Diagnosis present

## 2016-05-20 DIAGNOSIS — E1122 Type 2 diabetes mellitus with diabetic chronic kidney disease: Secondary | ICD-10-CM | POA: Diagnosis present

## 2016-05-20 DIAGNOSIS — R946 Abnormal results of thyroid function studies: Secondary | ICD-10-CM | POA: Diagnosis present

## 2016-05-20 DIAGNOSIS — Z888 Allergy status to other drugs, medicaments and biological substances status: Secondary | ICD-10-CM

## 2016-05-20 DIAGNOSIS — I5030 Unspecified diastolic (congestive) heart failure: Secondary | ICD-10-CM | POA: Diagnosis present

## 2016-05-20 DIAGNOSIS — Z66 Do not resuscitate: Secondary | ICD-10-CM | POA: Diagnosis present

## 2016-05-20 DIAGNOSIS — I2582 Chronic total occlusion of coronary artery: Secondary | ICD-10-CM | POA: Diagnosis present

## 2016-05-20 DIAGNOSIS — F0390 Unspecified dementia without behavioral disturbance: Secondary | ICD-10-CM | POA: Diagnosis present

## 2016-05-20 DIAGNOSIS — Z7982 Long term (current) use of aspirin: Secondary | ICD-10-CM | POA: Diagnosis not present

## 2016-05-20 DIAGNOSIS — G4733 Obstructive sleep apnea (adult) (pediatric): Secondary | ICD-10-CM | POA: Diagnosis present

## 2016-05-20 DIAGNOSIS — R0602 Shortness of breath: Secondary | ICD-10-CM

## 2016-05-20 DIAGNOSIS — I130 Hypertensive heart and chronic kidney disease with heart failure and stage 1 through stage 4 chronic kidney disease, or unspecified chronic kidney disease: Secondary | ICD-10-CM | POA: Diagnosis present

## 2016-05-20 DIAGNOSIS — N189 Chronic kidney disease, unspecified: Secondary | ICD-10-CM | POA: Diagnosis present

## 2016-05-20 HISTORY — DX: Type 2 diabetes mellitus without complications: E11.9

## 2016-05-20 HISTORY — DX: Heart failure, unspecified: I50.9

## 2016-05-20 LAB — CBC WITH DIFFERENTIAL/PLATELET
BASOS ABS: 0.1 10*3/uL (ref 0–0.1)
Basophils Relative: 1 %
EOS ABS: 0.1 10*3/uL (ref 0–0.7)
EOS PCT: 1 %
HCT: 46.5 % (ref 40.0–52.0)
Hemoglobin: 15.5 g/dL (ref 13.0–18.0)
LYMPHS PCT: 28 %
Lymphs Abs: 2.5 10*3/uL (ref 1.0–3.6)
MCH: 31.3 pg (ref 26.0–34.0)
MCHC: 33.4 g/dL (ref 32.0–36.0)
MCV: 93.7 fL (ref 80.0–100.0)
MONO ABS: 0.7 10*3/uL (ref 0.2–1.0)
Monocytes Relative: 8 %
Neutro Abs: 5.6 10*3/uL (ref 1.4–6.5)
Neutrophils Relative %: 62 %
PLATELETS: 174 10*3/uL (ref 150–440)
RBC: 4.96 MIL/uL (ref 4.40–5.90)
RDW: 13.2 % (ref 11.5–14.5)
WBC: 9 10*3/uL (ref 3.8–10.6)

## 2016-05-20 LAB — TSH: TSH: 4.869 u[IU]/mL — AB (ref 0.350–4.500)

## 2016-05-20 LAB — COMPREHENSIVE METABOLIC PANEL
ALK PHOS: 59 U/L (ref 38–126)
ALT: 40 U/L (ref 17–63)
AST: 38 U/L (ref 15–41)
Albumin: 4.1 g/dL (ref 3.5–5.0)
Anion gap: 7 (ref 5–15)
BILIRUBIN TOTAL: 0.6 mg/dL (ref 0.3–1.2)
BUN: 12 mg/dL (ref 6–20)
CALCIUM: 8.5 mg/dL — AB (ref 8.9–10.3)
CO2: 27 mmol/L (ref 22–32)
CREATININE: 0.76 mg/dL (ref 0.61–1.24)
Chloride: 103 mmol/L (ref 101–111)
GFR calc Af Amer: >60 mL/min (ref >60)
Glucose, Bld: 154 mg/dL — ABNORMAL HIGH (ref 65–99)
Potassium: 4.1 mmol/L (ref 3.5–5.1)
Sodium: 137 mmol/L (ref 135–145)
TOTAL PROTEIN: 6.6 g/dL (ref 6.5–8.1)

## 2016-05-20 LAB — GLUCOSE, CAPILLARY
GLUCOSE-CAPILLARY: 177 mg/dL — AB (ref 65–99)
GLUCOSE-CAPILLARY: 221 mg/dL — AB (ref 65–99)
Glucose-Capillary: 137 mg/dL — ABNORMAL HIGH (ref 65–99)
Glucose-Capillary: 162 mg/dL — ABNORMAL HIGH (ref 65–99)
Glucose-Capillary: 177 mg/dL — ABNORMAL HIGH (ref 65–99)

## 2016-05-20 LAB — TROPONIN I
Troponin I: <0.03 ng/mL (ref <0.031)
Troponin I: <0.03 ng/mL (ref <0.031)
Troponin I: <0.03 ng/mL (ref <0.031)
Troponin I: <0.03 ng/mL (ref <0.031)

## 2016-05-20 LAB — BRAIN NATRIURETIC PEPTIDE: B NATRIURETIC PEPTIDE 5: 74 pg/mL (ref 0.0–100.0)

## 2016-05-20 LAB — FIBRIN DERIVATIVES D-DIMER (ARMC ONLY): FIBRIN DERIVATIVES D-DIMER (ARMC): 652 — AB (ref 0–499)

## 2016-05-20 LAB — MRSA PCR SCREENING: MRSA by PCR: NEGATIVE

## 2016-05-20 MED ORDER — LISINOPRIL 20 MG PO TABS
20.0000 mg | ORAL_TABLET | Freq: Every day | ORAL | Status: DC
  Administered 2016-05-20 – 2016-05-22 (×3): 20 mg via ORAL
  Filled 2016-05-20 (×3): qty 1

## 2016-05-20 MED ORDER — CLOPIDOGREL BISULFATE 75 MG PO TABS
75.0000 mg | ORAL_TABLET | Freq: Every day | ORAL | Status: DC
  Administered 2016-05-20 – 2016-05-21 (×2): 75 mg via ORAL
  Filled 2016-05-20 (×2): qty 1

## 2016-05-20 MED ORDER — SODIUM CHLORIDE 0.9% FLUSH
3.0000 mL | Freq: Two times a day (BID) | INTRAVENOUS | Status: DC
Start: 2016-05-20 — End: 2016-05-23
  Administered 2016-05-20 – 2016-05-23 (×5): 3 mL via INTRAVENOUS

## 2016-05-20 MED ORDER — ISOSORBIDE MONONITRATE ER 60 MG PO TB24
120.0000 mg | ORAL_TABLET | Freq: Every day | ORAL | Status: DC
Start: 2016-05-20 — End: 2016-05-23
  Administered 2016-05-20 – 2016-05-23 (×4): 120 mg via ORAL
  Filled 2016-05-20 (×4): qty 2

## 2016-05-20 MED ORDER — OXYCODONE HCL 5 MG PO TABS
5.0000 mg | ORAL_TABLET | Freq: Two times a day (BID) | ORAL | Status: DC | PRN
  Administered 2016-05-20 – 2016-05-23 (×5): 5 mg via ORAL
  Filled 2016-05-20 (×5): qty 1

## 2016-05-20 MED ORDER — FUROSEMIDE 10 MG/ML IJ SOLN
20.0000 mg | Freq: Once | INTRAMUSCULAR | Status: AC
  Administered 2016-05-20: 20 mg via INTRAVENOUS
  Filled 2016-05-20: qty 4

## 2016-05-20 MED ORDER — DILTIAZEM HCL 100 MG IV SOLR
5.0000 mg/h | INTRAVENOUS | Status: DC
  Administered 2016-05-20 – 2016-05-21 (×6): 10 mg/h via INTRAVENOUS
  Filled 2016-05-20 (×5): qty 100

## 2016-05-20 MED ORDER — ACETAMINOPHEN 650 MG RE SUPP
650.0000 mg | Freq: Four times a day (QID) | RECTAL | Status: DC | PRN
Start: 2016-05-20 — End: 2016-05-23

## 2016-05-20 MED ORDER — NITROGLYCERIN 2 % TD OINT
1.0000 [in_us] | TOPICAL_OINTMENT | Freq: Once | TRANSDERMAL | Status: AC
  Administered 2016-05-20: 1 [in_us] via TOPICAL
  Filled 2016-05-20: qty 1

## 2016-05-20 MED ORDER — MEMANTINE HCL 10 MG PO TABS
5.0000 mg | ORAL_TABLET | Freq: Two times a day (BID) | ORAL | Status: DC
  Administered 2016-05-20 – 2016-05-23 (×7): 5 mg via ORAL
  Filled 2016-05-20 (×7): qty 1

## 2016-05-20 MED ORDER — FUROSEMIDE 10 MG/ML IJ SOLN
INTRAMUSCULAR | Status: AC
  Administered 2016-05-20: 20 mg via INTRAVENOUS
  Filled 2016-05-20: qty 2

## 2016-05-20 MED ORDER — ASPIRIN EC 81 MG PO TBEC
81.0000 mg | DELAYED_RELEASE_TABLET | Freq: Every day | ORAL | Status: DC
  Administered 2016-05-20 – 2016-05-23 (×4): 81 mg via ORAL
  Filled 2016-05-20 (×4): qty 1

## 2016-05-20 MED ORDER — METOPROLOL TARTRATE 50 MG PO TABS
50.0000 mg | ORAL_TABLET | Freq: Every day | ORAL | Status: DC
Start: 2016-05-20 — End: 2016-05-23
  Administered 2016-05-20 – 2016-05-23 (×4): 50 mg via ORAL
  Filled 2016-05-20 (×4): qty 1

## 2016-05-20 MED ORDER — RISPERIDONE 0.5 MG PO TABS
0.2500 mg | ORAL_TABLET | Freq: Every day | ORAL | Status: DC
  Administered 2016-05-20 – 2016-05-22 (×3): 0.25 mg via ORAL
  Filled 2016-05-20: qty 0.5
  Filled 2016-05-20 (×2): qty 1
  Filled 2016-05-20: qty 0.5

## 2016-05-20 MED ORDER — GUAIFENESIN-DM 100-10 MG/5ML PO SYRP
5.0000 mL | ORAL_SOLUTION | ORAL | Status: DC | PRN
  Administered 2016-05-20 – 2016-05-21 (×2): 5 mL via ORAL
  Filled 2016-05-20 (×3): qty 5

## 2016-05-20 MED ORDER — DILTIAZEM LOAD VIA INFUSION
15.0000 mg | Freq: Once | INTRAVENOUS | Status: AC
  Administered 2016-05-20: 15 mg via INTRAVENOUS
  Filled 2016-05-20: qty 15

## 2016-05-20 MED ORDER — SODIUM CHLORIDE 0.9 % IV SOLN: 250.0000 mL | INTRAVENOUS | Status: DC | PRN

## 2016-05-20 MED ORDER — APIXABAN 5 MG PO TABS
5.0000 mg | ORAL_TABLET | Freq: Two times a day (BID) | ORAL | Status: DC
  Administered 2016-05-20 – 2016-05-23 (×7): 5 mg via ORAL
  Filled 2016-05-20 (×7): qty 1

## 2016-05-20 MED ORDER — SODIUM CHLORIDE 0.9% FLUSH
3.0000 mL | INTRAVENOUS | Status: DC | PRN
  Administered 2016-05-22: 3 mL via INTRAVENOUS
  Filled 2016-05-20: qty 3

## 2016-05-20 MED ORDER — SODIUM CHLORIDE 0.9% FLUSH
3.0000 mL | Freq: Two times a day (BID) | INTRAVENOUS | Status: DC
  Administered 2016-05-20 – 2016-05-23 (×4): 3 mL via INTRAVENOUS

## 2016-05-20 MED ORDER — DEXTROSE 5 % IV SOLN
1.0000 g | INTRAVENOUS | Status: DC
  Administered 2016-05-20 – 2016-05-23 (×4): 1 g via INTRAVENOUS
  Filled 2016-05-20 (×4): qty 10

## 2016-05-20 MED ORDER — ROSUVASTATIN CALCIUM 10 MG PO TABS
10.0000 mg | ORAL_TABLET | Freq: Every day | ORAL | Status: DC
  Administered 2016-05-20 – 2016-05-22 (×3): 10 mg via ORAL
  Filled 2016-05-20 (×3): qty 1

## 2016-05-20 MED ORDER — FUROSEMIDE 10 MG/ML IJ SOLN
20.0000 mg | Freq: Once | INTRAMUSCULAR | Status: AC
  Administered 2016-05-20: 20 mg via INTRAVENOUS

## 2016-05-20 MED ORDER — ACETAMINOPHEN 325 MG PO TABS
650.0000 mg | ORAL_TABLET | Freq: Four times a day (QID) | ORAL | Status: DC | PRN
  Administered 2016-05-20 – 2016-05-21 (×2): 650 mg via ORAL
  Filled 2016-05-20 (×3): qty 2

## 2016-05-20 MED ORDER — INSULIN ASPART 100 UNIT/ML ~~LOC~~ SOLN
0.0000 [IU] | Freq: Every day | SUBCUTANEOUS | Status: DC
  Filled 2016-05-20: qty 1

## 2016-05-20 MED ORDER — METFORMIN HCL 500 MG PO TABS
1500.0000 mg | ORAL_TABLET | Freq: Every day | ORAL | Status: DC
  Administered 2016-05-20 – 2016-05-21 (×2): 1500 mg via ORAL
  Filled 2016-05-20 (×2): qty 3

## 2016-05-20 MED ORDER — INSULIN ASPART 100 UNIT/ML ~~LOC~~ SOLN
0.0000 [IU] | Freq: Three times a day (TID) | SUBCUTANEOUS | Status: DC
  Administered 2016-05-20: 2 [IU] via SUBCUTANEOUS
  Administered 2016-05-20: 3 [IU] via SUBCUTANEOUS
  Administered 2016-05-21: 1 [IU] via SUBCUTANEOUS
  Administered 2016-05-21 (×2): 2 [IU] via SUBCUTANEOUS
  Administered 2016-05-22 (×3): 1 [IU] via SUBCUTANEOUS
  Administered 2016-05-23 (×2): 2 [IU] via SUBCUTANEOUS
  Filled 2016-05-20: qty 2
  Filled 2016-05-20: qty 1
  Filled 2016-05-20: qty 3
  Filled 2016-05-20: qty 2
  Filled 2016-05-20: qty 1
  Filled 2016-05-20 (×3): qty 2
  Filled 2016-05-20: qty 1

## 2016-05-20 MED ORDER — ENOXAPARIN SODIUM 40 MG/0.4ML ~~LOC~~ SOLN
40.0000 mg | Freq: Two times a day (BID) | SUBCUTANEOUS | Status: DC
  Filled 2016-05-20: qty 0.4

## 2016-05-20 MED ORDER — VENLAFAXINE HCL ER 75 MG PO CP24
75.0000 mg | ORAL_CAPSULE | Freq: Every day | ORAL | Status: DC
Start: 2016-05-20 — End: 2016-05-23
  Administered 2016-05-20 – 2016-05-23 (×4): 75 mg via ORAL
  Filled 2016-05-20 (×4): qty 1

## 2016-05-20 NOTE — Progress Notes (Signed)
Pt reporting severe lower back pain, reports he takes hydrocodone at home. Only PRN available is tylenol and it is not time for this yet. Dr. Amado CoeGouru text paged and made aware. Awaiting response.

## 2016-05-20 NOTE — Progress Notes (Signed)
Dr. Amado CoeGouru called back, checked notes from [facility] and oxycodone orders listed. Dr. Amado CoeGouru gave orders to restart this home PRN dose of oxycodone.

## 2016-05-20 NOTE — Progress Notes (Addendum)
Patient transferred from ICU to 2A rm 243, [facility] Oriented to room, Ascom phones, call bell and staff. Bed in low position. Fall safety plan reviewed with pt and wife; pt declined non-skid socks, bed alarm on. Full assessment to Epic; skin assessed with Larose Hiresammy Todd, RN. Cardizem gtt infusing at 10mg /hr, verified with Sonia BallerAmelia RN. Telemetry box verified with CCMD and Sampson Goonhris McCollum NT: [id number], pt remains in a flutter 100s. Will continue to monitor.

## 2016-05-20 NOTE — Care Management (Signed)
Informed that patient is for [facility].  Spoke with patient's wife.  Patient has been resident for the past one year at [facility]. patient ambulates with a walker.   Mrs. Thomas Watkins acknowledges agreement with transfer to the [facility].  Spoke with Thomas MichaelsLinda Watkins and at present, she has not found any information "in the system" that a transfer is in progress.  Patient was admitted to icu stepdown due to the need for continuous bipap but has since weaned to nasal cannula. The 02 requirement is new.   will need to be very proactive about assessment for home 02 as it would have to be provided [facility].  Per Thomas QuinLinda- patient would have to be stable off bipap for 24 hours before could [facility]. Will give her update 6/21.  It is hoped that patient will be able to transfer back to [facility] and Mrs Thomas Watkins would be agreeable to home health.  She sincerely hopes that patient will not require skilled nursing due to the fact it would be another new environment and he is use to [facility].

## 2016-05-20 NOTE — Progress Notes (Signed)
Pt rested quietly since transfer to 243. VSS, 3L [place] maintained. HR remains afib 70s, occasionally increases to 130s-140s at random so cardizem gtt has been maintained at 10mg /hr. PRN oxycodone given for chronic back pain. Will continue to monitor.

## 2016-05-20 NOTE — ED Provider Notes (Signed)
Time Seen: Approximately 0 250  I have reviewed the triage notes  Chief Complaint: Respiratory Distress   History of Present Illness: Thomas Watkins is a 70 y.o. male who has a history of dementia, coronary artery disease, hypertension, sleep apnea, pulmonary edema who had acute onset of respiratory distress 3 hours prior to arrival. Patient denies any fever. He states he did have a cough which is been nonproductive in nature. Patient was transported here from [facility] memory unit for respiratory distress and was placed on CPAP. Patient denies any chest pain, calf tenderness etc.   Past Medical History  Diagnosis Date  . CAD (coronary artery disease)     a. CABG 2000; b. multiple stents; c. cardiac cath 02/05/2015: occluded mLAD, patent mid to distal LAD after the 100% occlusion with free LIMA to the mid LAD,  patent mid LAD stent w/ mild ISR, patent proximal to mid LAD stent before 100% occlusion, LCx & RCA with mild diffuse disease. EF >55%. No intervention needed. Medical management. CP likely not cardiac.   . Diastolic dysfunction     a. echo 01/2015: EF 60-65%, diastolic dysfunction, mild LVH, mildly dilated LA, mild AS (possibly calcified), mild TR,   . HTN (hypertension)   . OSA (obstructive sleep apnea)   . Dementia     Patient Active Problem List   Diagnosis Date Noted  . CAD (coronary artery disease)   . Diastolic dysfunction   . HTN (hypertension)   . OSA (obstructive sleep apnea)   . Dementia     History reviewed. No pertinent past surgical history.  History reviewed. No pertinent past surgical history.  No current outpatient prescriptions on file.  Allergies:  Review of patient's allergies indicates no known allergies.  Family History: History reviewed. No pertinent family history.  Social History: Social History  Substance Use Topics  . Smoking status: Former Games developer  . Smokeless tobacco: None  . Alcohol Use: None     Review of Systems:   10  point review of systems was performed and was otherwise negative:  Constitutional: No fever Eyes: No visual disturbances ENT: No sore throat, ear pain Cardiac: No chest pain Respiratory: Shortness of breath with trouble lying flat. Abdomen: No abdominal pain, no vomiting, No diarrhea Endocrine: No weight loss, No night sweats Extremities: No peripheral edema, cyanosis Skin: No rashes, easy bruising Neurologic: No focal weakness, trouble with speech or swollowing Urologic: No dysuria, Hematuria, or urinary frequency   Physical Exam:  ED Triage Vitals  Enc Vitals Group     BP 05/20/16 0307 130/106 mmHg     Pulse Rate 05/20/16 0240 88     Resp 05/20/16 0240 16     Temp 05/20/16 0307 98.2 F (36.8 C)     Temp Source 05/20/16 0307 Axillary     SpO2 05/20/16 0240 98 %     Weight --      Height --      Head Cir --      Peak Flow --      Pain Score 05/20/16 0239 0     Pain Loc --      Pain Edu? --      Excl. in GC? --     General: Awake , Alert , and Oriented times 3; GCS 15Respiratory distress and currently on BiPAP. Head: Normal cephalic , atraumatic Eyes: Pupils equal , round, reactive to light Nose/Throat: No nasal drainage, patent upper airway without erythema or exudate.  Neck: Supple, Full  range of motion, No anterior adenopathy or palpable thyroid masses Lungs: Rales heard throughout and symmetrically primarily at the apices. No rhonchi is noted Heart: Irregular rate irregular rhythm without murmurs , gallops , or rubs Abdomen: Soft, non tender without rebound, guarding , or rigidity; bowel sounds positive and symmetric in all 4 quadrants. No organomegaly .        Extremities: 2 plus symmetric pulses. No edema, clubbing or cyanosis Neurologic: normal ambulation, Motor symmetric without deficits, sensory intact Skin: warm, dry, no rashes   Labs:   All laboratory work was reviewed including any pertinent negatives or positives listed below:  Labs Reviewed   COMPREHENSIVE METABOLIC PANEL - Abnormal; Notable for the following:    Glucose, Bld 154 (*)    Calcium 8.5 (*)    All other components within normal limits  BRAIN NATRIURETIC PEPTIDE  CBC WITH DIFFERENTIAL/PLATELET  TROPONIN I  Laboratory work was reviewed and showed no clinically significant abnormalities.   EKG: * ED ECG REPORT I, Jennye MoccasinBrian S Janan Bogie, the attending physician, personally viewed and interpreted this ECG.  Date: 05/20/2016 EKG Time: 0249 Rate: 108 Rhythm: Atrial flutter with occasional PVCs QRS Axis: normal Intervals: normal ST/T Wave abnormalities: Right bundle-branch block pattern  DG Chest Port 1 View (Final result) Result time: 05/20/16 03:09:33   Final result by Rad Results In Interface (05/20/16 03:09:33)   Narrative:   CLINICAL DATA: 70 year old male with shortness of breath.  EXAM: PORTABLE CHEST 1 VIEW  COMPARISON: Chest radiograph dated 02/06/2015  FINDINGS: Single portable view of the chest demonstrates left lung base atelectatic changes versus less likely infiltrate. The right lung is clear. There is no pleural effusion or pneumothorax. Stable cardiac silhouette. Median sternotomy wires. No acute osseous pathology.  IMPRESSION: Left lung base atelectasis versus less likely infiltrate.   Electronically Signed By: Elgie CollardArash Radparvar M.D. On: 05/20/2016 03:09    Conduction Disturbances: none Narrative Interpretation: unremarkable    Radiology: *          I personally reviewed the radiologic studies    Critical Care:  CRITICAL CARE Performed by: Jennye MoccasinBrian S Shaletta Hinostroza   Total critical care time: 33 initiation of antiarrhythmic medication minutes  Critical care time was exclusive of separately billable procedures and treating other patients.  Critical care was necessary to treat or prevent imminent or life-threatening deterioration.  Critical care was time spent personally by me on the following activities: development  of treatment plan with patient and/or surrogate as well as nursing, discussions with consultants, evaluation of patient's response to treatment, examination of patient, obtaining history from patient or surrogate, ordering and performing treatments and interventions, ordering and review of laboratory studies, ordering and review of radiographic studies, pulse oximetry and re-evaluation of patient's condition.   ED Course:  Patient presents with new onset atrial flutter with accelerated rate and symptoms indicative of high output congestive heart failure. His chest x-ray and BNP labs do not match congestive heart failure at this point he does sound like Rales heard throughout and certainly presents with typical symptoms of congestive heart failure. Patient otherwise is been afebrile and there is no obvious infiltrate and his white blood cell count is normal at this time. Patient was started on diltiazem and had an inch of nitroglycerin paste ordered along with Lasix. Patient was weaned off the BiPAP down to a 4 L nasal cannula and seems to have stabilized. Assessment:  Acute respiratory distress Atrial flutter with a rapid ventricular rate Pulmonary edema    Plan:  Inpatient management           Jennye Moccasin, MD 05/20/16 [phone]

## 2016-05-20 NOTE — Progress Notes (Addendum)
ANTICOAGULATION CONSULT NOTE - Initial Consult  Pharmacy Consult for Apixaban Indication: atrial fibrillation  Allergies  Allergen Reactions  . Atorvastatin Other (See Comments)    Joint swelling  . Duloxetine Hcl Rash  . Carbamazepine   . Lecithin   . Oxcarbazepine   . Tizanidine Hcl   . Yellow Dyes (Non-Tartrazine)     Yellow dye #5 specifically    Patient Measurements: Height: 6' (182.9 cm) Weight: (!) 316 lb 12.8 oz (143.7 kg) IBW/kg (Calculated) : 77.6  Vital Signs: Temp: 98.4 F (36.9 C) (06/20 0758) Temp Source: Oral (06/20 0758) BP: 140/66 mmHg (06/20 1030) Pulse Rate: 94 (06/20 1030)  Labs:  Recent Labs  05/20/16 0250 05/20/16 0913  HGB 15.5  --   HCT 46.5  --   PLT 174  --   CREATININE 0.76  --   TROPONINI <0.03 <0.03    Estimated Creatinine Clearance: 122.8 mL/min (by C-G formula based on Cr of 0.76).   Medical History: Past Medical History  Diagnosis Date  . CAD (coronary artery disease)     a. CABG 2000; b. multiple stents; c. cardiac cath 02/05/2015: occluded mLAD, patent mid to distal LAD after the 100% occlusion with free LIMA to the mid LAD,  patent mid LAD stent w/ mild ISR, patent proximal to mid LAD stent before 100% occlusion, LCx & RCA with mild diffuse disease. EF >55%. No intervention needed. Medical management. CP likely not cardiac.   . Diastolic dysfunction     a. echo 01/2015: EF 60-65%, diastolic dysfunction, mild LVH, mildly dilated LA, mild AS (possibly calcified), mild TR,   . HTN (hypertension)   . OSA (obstructive sleep apnea)   . Dementia     Medications:  Scheduled:  . apixaban  5 mg Oral BID  . aspirin EC  81 mg Oral Daily  . cefTRIAXone (ROCEPHIN)  IV  1 g Intravenous Q24H  . clopidogrel  75 mg Oral Daily  . furosemide      . insulin aspart  0-5 Units Subcutaneous QHS  . insulin aspart  0-9 Units Subcutaneous TID WC  . isosorbide mononitrate  120 mg Oral Daily  . lisinopril  20 mg Oral Daily  . memantine  5 mg  Oral BID  . metFORMIN  1,500 mg Oral Q breakfast  . metoprolol tartrate  50 mg Oral Daily  . risperiDONE  0.25 mg Oral QHS  . rosuvastatin  10 mg Oral q1800  . sodium chloride flush  3 mL Intravenous Q12H  . sodium chloride flush  3 mL Intravenous Q12H  . venlafaxine XR  75 mg Oral Q breakfast   Infusions:  . diltiazem (CARDIZEM) infusion 10 mg/hr (05/20/16 1030)    Assessment: 70 y/o M with a h/o CHF admitted with new-onset atrial fibrillation to begin Eliquis. Some suspicion for PE per MD note, but Dr. Amado CoeGouru wants to use afib dosing for now.   Plan:  Will begin Eliquis 5 mg bid.   Luisa HartChristy, Uri Turnbough D 05/20/2016,11:00 AM

## 2016-05-20 NOTE — H&P (Addendum)
Thomas Watkins is an 70 y.o. male.   Chief Complaint: Cough/ Shortness of breath HPI: Started having cough and shortness of breath earlier today. No fever. Shortness of breath worse while lying down. Presented to ED and was found hypoxic. Placed on bipap.  Past Medical History  Diagnosis Date  . CAD (coronary artery disease)     a. CABG 2000; b. multiple stents; c. cardiac cath 02/05/2015: occluded mLAD, patent mid to distal LAD after the 100% occlusion with free LIMA to the mid LAD,  patent mid LAD stent w/ mild ISR, patent proximal to mid LAD stent before 100% occlusion, LCx & RCA with mild diffuse disease. EF >55%. No intervention needed. Medical management. CP likely not cardiac.   . Diastolic dysfunction     a. echo 01/2015: EF 42-70%, diastolic dysfunction, mild LVH, mildly dilated LA, mild AS (possibly calcified), mild TR,   . HTN (hypertension)   . OSA (obstructive sleep apnea)   . Dementia     History reviewed. No pertinent past surgical history.   History reviewed. No pertinent family history.  CAD Social History:  reports that he has quit smoking. He does not have any smokeless tobacco history on file. His alcohol and drug histories are not on file.  Allergies: No Known Allergies   (Not in a hospital admission)  Results for orders placed or performed during the hospital encounter of 05/20/16 (from the past 48 hour(s))  Comprehensive metabolic panel     Status: Abnormal   Collection Time: 05/20/16  2:50 AM  Result Value Ref Range   Sodium 137 135 - 145 mmol/L   Potassium 4.1 3.5 - 5.1 mmol/L   Chloride 103 101 - 111 mmol/L   CO2 27 22 - 32 mmol/L   Glucose, Bld 154 (H) 65 - 99 mg/dL   BUN 12 6 - 20 mg/dL   Creatinine, Ser 0.76 0.61 - 1.24 mg/dL   Calcium 8.5 (L) 8.9 - 10.3 mg/dL   Total Protein 6.6 6.5 - 8.1 g/dL   Albumin 4.1 3.5 - 5.0 g/dL   AST 38 15 - 41 U/L   ALT 40 17 - 63 U/L   Alkaline Phosphatase 59 38 - 126 U/L   Total Bilirubin 0.6 0.3 - 1.2 mg/dL    GFR calc non Af Amer >60 >60 mL/min   GFR calc Af Amer >60 >60 mL/min    Comment: (NOTE) The eGFR has been calculated using the CKD EPI equation. This calculation has not been validated in all clinical situations. eGFR's persistently <60 mL/min signify possible Chronic Kidney Disease.    Anion gap 7 5 - 15  CBC with Differential/Platelet     Status: None   Collection Time: 05/20/16  2:50 AM  Result Value Ref Range   WBC 9.0 3.8 - 10.6 K/uL   RBC 4.96 4.40 - 5.90 MIL/uL   Hemoglobin 15.5 13.0 - 18.0 g/dL   HCT 46.5 40.0 - 52.0 %   MCV 93.7 80.0 - 100.0 fL   MCH 31.3 26.0 - 34.0 pg   MCHC 33.4 32.0 - 36.0 g/dL   RDW 13.2 11.5 - 14.5 %   Platelets 174 150 - 440 K/uL   Neutrophils Relative % 62 %   Neutro Abs 5.6 1.4 - 6.5 K/uL   Lymphocytes Relative 28 %   Lymphs Abs 2.5 1.0 - 3.6 K/uL   Monocytes Relative 8 %   Monocytes Absolute 0.7 0.2 - 1.0 K/uL   Eosinophils Relative 1 %  Eosinophils Absolute 0.1 0 - 0.7 K/uL   Basophils Relative 1 %   Basophils Absolute 0.1 0 - 0.1 K/uL  Troponin I     Status: None   Collection Time: 05/20/16  2:50 AM  Result Value Ref Range   Troponin I <0.03 <0.031 ng/mL    Comment:        NO INDICATION OF MYOCARDIAL INJURY.   Brain natriuretic peptide     Status: None   Collection Time: 05/20/16  2:55 AM  Result Value Ref Range   B Natriuretic Peptide 74.0 0.0 - 100.0 pg/mL   Dg Chest Port 1 View  05/20/2016  CLINICAL DATA:  70 year old male with shortness of breath. EXAM: PORTABLE CHEST 1 VIEW COMPARISON:  Chest radiograph dated 02/06/2015 FINDINGS: Single portable view of the chest demonstrates left lung base atelectatic changes versus less likely infiltrate. The right lung is clear. There is no pleural effusion or pneumothorax. Stable cardiac silhouette. Median sternotomy wires. No acute osseous pathology. IMPRESSION: Left lung base atelectasis versus less likely infiltrate. Electronically Signed   By: Anner Crete M.D.   On: 05/20/2016  03:09    Review of Systems  Constitutional: Negative for fever and chills.  HENT: Negative for hearing loss.   Eyes: Negative for blurred vision.  Respiratory: Positive for cough and shortness of breath.   Cardiovascular: Negative for chest pain.  Gastrointestinal: Negative for nausea and vomiting.  Genitourinary: Negative for dysuria.  Musculoskeletal: Negative for back pain.  Skin: Negative for rash.  Neurological: Negative for dizziness and focal weakness.    Blood pressure 159/71, pulse 77, temperature 98.2 F (36.8 C), temperature source Axillary, resp. rate 25, SpO2 98 %. Physical Exam  Constitutional: He is oriented to person, place, and time.  Well nourished male in mild respiratory distress.  HENT:  Head: Normocephalic and atraumatic.  Mouth/Throat: Oropharynx is clear and moist. No oropharyngeal exudate.  Eyes: EOM are normal. Pupils are equal, round, and reactive to light. No scleral icterus.  Neck: Normal range of motion. Neck supple. No JVD present. No tracheal deviation present. No thyromegaly present.  Cardiovascular:  Irregular. No murmurs.   Respiratory:  Scattered crackles throughout both lung fields. No dullness to percusion. Using accessary muscles.  GI: Soft. Bowel sounds are normal. He exhibits no mass. There is no tenderness.  Musculoskeletal: He exhibits edema. He exhibits no tenderness.  Lymphadenopathy:    He has no cervical adenopathy.  Neurological: He is alert and oriented to person, place, and time. No cranial nerve deficit.  Skin: Skin is warm and dry. No rash noted.     Assessment/Plan 1.Acute Respiratory Failure: Likely combination of CHF brought on by a-fib with RVR and bronchitis. Pt has been taken off bipap but is still requiring 4L oxygen. Will continue oxygen support. If worsens will place back on bipap. Will also check d-dimer to consider PE but already on full dose anticoagulation.  2. Atrial Fib./ Atrial Flutter with RVR: Currently  on cardizem drip to control heart rate. This is new onset. Will continue drip and try to wean to oral medication. Anticoagulate. Echo to evaluate heart. Consult cardiology. He is followed by cardiologist in [place], Dr Vidal Schwalbe.  3. CHF: Has history of dyastolic CHF. CXR not very impressive and BNP not elevated. However, sounds very wet on exam. Given lasix in ED.   4. Bronchitis: Start IV rocephin.  5. CAD: Continue current meds.  6. Code Status: Discussed with patient and he wishes to remain DNR as is  documented on his out of facility sheet.  Total time= 60 min  Baxter Hire, MD 05/20/2016, 5:24 AM

## 2016-05-20 NOTE — Progress Notes (Signed)
Pt complaining of right sided chest pain. Pt states more lower and to the side of his arm pit. Pt given tylenol and Dr. Amado CoeGouru called. Ahyan Kreeger E 1:08 PM 05/20/2016

## 2016-05-20 NOTE — ED Notes (Signed)
[facility]: SOB, wheeze and coarse crackles bilaterally. Pt arrived on C-PAP and transitioned onto BiPAP in the ED. SpO2-98%.

## 2016-05-20 NOTE — Progress Notes (Addendum)
[facility] [place] [facility]   PATIENT NAME: Thomas Watkins    MR#:  [id number]  DATE OF BIRTH:  01/12/1946  SUBJECTIVE:  CHIEF COMPLAINT:  Patient's shortness of breath is better. Palpitations are improved Denies any chest pain. Reporting swelling in his feet  REVIEW OF SYSTEMS:  CONSTITUTIONAL: No fever, fatigue or weakness.  EYES: No blurred or double vision.  EARS, NOSE, AND THROAT: No tinnitus or ear pain.  RESPIRATORY: No cough, Improving shortness of breath, denies wheezing or hemoptysis.  CARDIOVASCULAR: No chest pain, orthopnea, edema.  GASTROINTESTINAL: No nausea, vomiting, diarrhea or abdominal pain.  GENITOURINARY: No dysuria, hematuria.  ENDOCRINE: No polyuria, nocturia,  HEMATOLOGY: No anemia, easy bruising or bleeding SKIN: No rash or lesion. MUSCULOSKELETAL: No joint pain or arthritis.  Reporting feet swelling NEUROLOGIC: No tingling, numbness, weakness.  PSYCHIATRY: No anxiety or depression.   DRUG ALLERGIES:   Allergies  Allergen Reactions  . Atorvastatin Other (See Comments)    Joint swelling  . Duloxetine Hcl Rash  . Carbamazepine   . Lecithin   . Oxcarbazepine   . Tizanidine Hcl   . Yellow Dyes (Non-Tartrazine)     Yellow dye #5 specifically    VITALS:  Blood pressure 132/62, pulse 74, temperature 98.4 F (36.9 C), temperature source Oral, resp. rate 26, height 6' (1.829 m), weight 143.7 kg (316 lb 12.8 oz), SpO2 98 %.  PHYSICAL EXAMINATION:  GENERAL:  70 y.o.-year-old patient lying in the bed with no acute distress.  EYES: Pupils equal, round, reactive to light and accommodation. No scleral icterus. Extraocular muscles intact.  HEENT: Head atraumatic, normocephalic. Oropharynx and nasopharynx clear.  NECK:  Supple, no jugular venous distention. No thyroid enlargement, no tenderness.  LUNGS: Moderate breath sounds bilaterally, no wheezing, rales,rhonchi or crepitation. No use of accessory muscles of respiration.   CARDIOVASCULAR:Irregularly irregular No murmurs, rubs, or gallops.  ABDOMEN: Soft, nontender, nondistended. Bowel sounds present. No organomegaly or mass.  EXTREMITIES: 2+ pitting pedal edema, no cyanosis, or clubbing.  NEUROLOGIC: Cranial nerves II through XII are intact. Muscle strength 5/5 in all extremities. Sensation intact. Gait not checked.  PSYCHIATRIC: The patient is alert and oriented x 3.  SKIN: No obvious rash, lesion, or ulcer.    LABORATORY PANEL:   CBC  Recent Labs Lab 05/20/16 0250  WBC 9.0  HGB 15.5  HCT 46.5  PLT 174   ------------------------------------------------------------------------------------------------------------------  Chemistries   Recent Labs Lab 05/20/16 0250  NA 137  K 4.1  CL 103  CO2 27  GLUCOSE 154*  BUN 12  CREATININE 0.76  CALCIUM 8.5*  AST 38  ALT 40  ALKPHOS 59  BILITOT 0.6   ------------------------------------------------------------------------------------------------------------------  Cardiac Enzymes  Recent Labs Lab 05/20/16 0250  TROPONINI <0.03   ------------------------------------------------------------------------------------------------------------------  RADIOLOGY:  Dg Chest Port 1 View  05/20/2016  CLINICAL DATA:  70 year old male with shortness of breath. EXAM: PORTABLE CHEST 1 VIEW COMPARISON:  Chest radiograph dated 02/06/2015 FINDINGS: Single portable view of the chest demonstrates left lung base atelectatic changes versus less likely infiltrate. The right lung is clear. There is no pleural effusion or pneumothorax. Stable cardiac silhouette. Median sternotomy wires. No acute osseous pathology. IMPRESSION: Left lung base atelectasis versus less likely infiltrate. Electronically Signed   By: Elgie CollardArash  Radparvar M.D.   On: 05/20/2016 03:09    EKG:   Orders placed or performed in visit on 05/20/16  . EKG 12-Lead  . EKG 12-Lead    ASSESSMENT AND PLAN:   1.Acute  Respiratory Failure: Likely  combination of CHF brought on by a-fib with RVR and bronchitis.  Pt is  off bipap but is still requiring 4L oxygen. Will continue oxygen support. If worsens will place back on bipap. Will also check d-dimer to consider PE but already on full dose anticoagulation.  2. Atrial Fib./ Atrial Flutter with RVR: New onset- Continue  cardizem drip to control heart rate, wean to oral medication.  Anticoagulate with Eliquis  Echo to evaluate heart.  Consult [facility] cardiology. He is followed by cardiologist in [place], Dr Abran Cantor.  3. CHF: Has history of dyastolic CHF.  CXR not very impressive and BNP not elevated  has received 2 doses of IV Lasix Monitor daily weights and intake and output Not on diuretics at home  4. Acute Bronchitis: IV rocephin.  5. CAD: continue aspirin 81 mg, Plavix 75 mg      All the records are reviewed and case discussed with Care Management/Social Workerr. Management plans discussed with the patient, family and they are in agreement.  CODE STATUS: DNR  TOTAL CRITICAL CARE TIME TAKING CARE OF THIS PATIENT: .   POSSIBLE D/C IN 2-3 DAYS, DEPENDING ON CLINICAL CONDITION.  Note: This dictation was prepared with Dragon dictation along with smaller phrase technology. Any transcriptional errors that result from this process are unintentional.   Ramonita Lab M.D on 05/20/2016 at 10:02 AM  Between 7am to 6pm - Pager - [phone] After 6pm go to [email or web address] - password [email or web address]  [facility]  Office  [phone]  CC: Primary care physician; No primary care provider on file.

## 2016-05-21 ENCOUNTER — Inpatient Hospital Stay: Payer: Non-veteran care

## 2016-05-21 LAB — CBC
HCT: 43.3 % (ref 40.0–52.0)
Hemoglobin: 14.5 g/dL (ref 13.0–18.0)
MCH: 31.8 pg (ref 26.0–34.0)
MCHC: 33.5 g/dL (ref 32.0–36.0)
MCV: 94.7 fL (ref 80.0–100.0)
PLATELETS: 156 10*3/uL (ref 150–440)
RBC: 4.57 MIL/uL (ref 4.40–5.90)
RDW: 13.3 % (ref 11.5–14.5)
WBC: 10.5 10*3/uL (ref 3.8–10.6)

## 2016-05-21 LAB — BASIC METABOLIC PANEL
Anion gap: 8 (ref 5–15)
BUN: 18 mg/dL (ref 6–20)
CALCIUM: 8.3 mg/dL — AB (ref 8.9–10.3)
CO2: 28 mmol/L (ref 22–32)
CREATININE: 0.99 mg/dL (ref 0.61–1.24)
Chloride: 100 mmol/L — ABNORMAL LOW (ref 101–111)
GFR calc non Af Amer: >60 mL/min (ref >60)
Glucose, Bld: 142 mg/dL — ABNORMAL HIGH (ref 65–99)
Potassium: 4.2 mmol/L (ref 3.5–5.1)
SODIUM: 136 mmol/L (ref 135–145)

## 2016-05-21 LAB — TSH: TSH: 5.09 u[IU]/mL — AB (ref 0.350–4.500)

## 2016-05-21 LAB — GLUCOSE, CAPILLARY
GLUCOSE-CAPILLARY: 129 mg/dL — AB (ref 65–99)
GLUCOSE-CAPILLARY: 143 mg/dL — AB (ref 65–99)
Glucose-Capillary: 176 mg/dL — ABNORMAL HIGH (ref 65–99)
Glucose-Capillary: 176 mg/dL — ABNORMAL HIGH (ref 65–99)

## 2016-05-21 MED ORDER — LEVALBUTEROL HCL 1.25 MG/0.5ML IN NEBU
1.2500 mg | INHALATION_SOLUTION | Freq: Four times a day (QID) | RESPIRATORY_TRACT | Status: DC | PRN
  Administered 2016-05-21 – 2016-05-22 (×4): 1.25 mg via RESPIRATORY_TRACT
  Filled 2016-05-21 (×4): qty 0.5

## 2016-05-21 MED ORDER — DOCUSATE SODIUM 100 MG PO CAPS
100.0000 mg | ORAL_CAPSULE | Freq: Two times a day (BID) | ORAL | Status: DC
  Administered 2016-05-21 – 2016-05-23 (×4): 100 mg via ORAL
  Filled 2016-05-21 (×4): qty 1

## 2016-05-21 MED ORDER — METFORMIN HCL ER 750 MG PO TB24
1500.0000 mg | ORAL_TABLET | Freq: Every day | ORAL | Status: DC
  Administered 2016-05-22 – 2016-05-23 (×2): 1500 mg via ORAL
  Filled 2016-05-21 (×2): qty 2

## 2016-05-21 MED ORDER — DILTIAZEM HCL 30 MG PO TABS
30.0000 mg | ORAL_TABLET | Freq: Four times a day (QID) | ORAL | Status: DC
  Administered 2016-05-21 – 2016-05-23 (×9): 30 mg via ORAL
  Filled 2016-05-21 (×9): qty 1

## 2016-05-21 MED ORDER — IOPAMIDOL (ISOVUE-370) INJECTION 76%
100.0000 mL | Freq: Once | INTRAVENOUS | Status: AC | PRN
  Administered 2016-05-21: 100 mL via INTRAVENOUS

## 2016-05-21 MED ORDER — SENNA 8.6 MG PO TABS
1.0000 | ORAL_TABLET | Freq: Two times a day (BID) | ORAL | Status: DC
  Administered 2016-05-21 – 2016-05-23 (×4): 8.6 mg via ORAL
  Filled 2016-05-21 (×4): qty 1

## 2016-05-21 NOTE — NC FL2 (Signed)
[place] MEDICAID FL2 LEVEL OF CARE SCREENING TOOL     IDENTIFICATION  Patient Name: Thomas Watkins Birthdate: 1946/10/26 Sex: male Admission Date (Current Location): 05/20/2016  [place] and [place] Number:  Chiropodist and Address:  [facility], [address] [place], [place] [address]      Provider Number: [id number]  Attending Physician Name and Address:  Ramonita Lab, MD  Relative Name and Phone Number:       Current Level of Care: Hospital Recommended Level of Care: [facility] Prior Approval Number:    Date Approved/Denied:   PASRR Number:  [id number])  Discharge Plan: [facility]   Current Diagnoses: Patient Active Problem List   Diagnosis Date Noted  . Atrial fibrillation with RVR (HCC) 05/20/2016  . CAD (coronary artery disease)   . Diastolic dysfunction   . HTN (hypertension)   . OSA (obstructive sleep apnea)   . Dementia     Orientation RESPIRATION BLADDER Height & Weight     Self, Time, Situation, Place  O2 (Nasal Cannula 2 (L/min) ) Continent Weight: (!) 318 lb 11.2 oz (144.561 kg) Height:  6' (182.9 cm)  BEHAVIORAL SYMPTOMS/MOOD NEUROLOGICAL BOWEL NUTRITION STATUS   (None )  (None) Continent Diet (Carb Modified )  AMBULATORY STATUS COMMUNICATION OF NEEDS Skin   Supervision Verbally Normal                       Personal Care Assistance Level of Assistance  Bathing, Feeding, Dressing Bathing Assistance: Limited assistance Feeding assistance: Independent Dressing Assistance: Limited assistance     Functional Limitations Info  Sight, Hearing, Speech Sight Info: Impaired Hearing Info: Adequate Speech Info: Adequate    SPECIAL CARE FACTORS FREQUENCY                       Contractures      Additional Factors Info  Code Status, Allergies, Insulin Sliding Scale Code Status Info:  (DNR) Allergies Info:  (Atorvastatin, Duloxetine Hcl, Carbamazepine,  Lecithin, Oxcarbazepine, Tizanidine Hcl, Yellow Dyes (Non-tartrazine))   Insulin Sliding Scale Info:  (insulin aspart (novoLOG) injection 0-5 Units 0-5 Units, Subcutaneous, Daily at bedtime ; insulin aspart (novoLOG) injection 0-9 Units 0-9 Units, Subcutaneous, 3 times daily with meals)        Discharge Medications: DISCHARGE MEDICATIONS:   Current Discharge Medication List    START taking these medications   Details  albuterol (PROVENTIL HFA;VENTOLIN HFA) 108 (90 Base) MCG/ACT inhaler Inhale 2 puffs into the lungs every 6 (six) hours as needed for wheezing or shortness of breath. Qty: 1 Inhaler, Refills: 2    amoxicillin-clavulanate (AUGMENTIN) 875-125 MG tablet Take 1 tablet by mouth 2 (two) times daily. Qty: 10 tablet, Refills: 0    apixaban (ELIQUIS) 5 MG TABS tablet Take 1 tablet (5 mg total) by mouth 2 (two) times daily. Qty: 60 tablet, Refills: 0    diltiazem (CARDIZEM CD) 120 MG 24 hr capsule Take 1 capsule (120 mg total) by mouth daily. Qty: 30 capsule, Refills: 0    furosemide (LASIX) 20 MG tablet Take 1 tablet (20 mg total) by mouth 2 (two) times daily. Qty: 30 tablet, Refills: 0    guaiFENesin-dextromethorphan (ROBITUSSIN DM) 100-10 MG/5ML syrup Take 5 mLs by mouth every 4 (four) hours as needed for cough. Qty: 118 mL, Refills: 0    memantine (NAMENDA) 5 MG tablet Take 1 tablet (5 mg total) by mouth 2 (two) times  daily. Qty: 60 tablet, Refills: 0    metFORMIN (GLUCOPHAGE-XR) 750 MG 24 hr tablet Take 2 tablets (1,500 mg total) by mouth daily with breakfast. Qty: 60 tablet, Refills: 0    oxyCODONE (OXY IR/ROXICODONE) 5 MG immediate release tablet Take 1 tablet (5 mg total) by mouth 2 (two) times daily as needed for severe pain. Qty: 30 tablet, Refills: 0    risperiDONE (RISPERDAL) 0.25 MG tablet Take 1 tablet (0.25 mg total) by mouth at bedtime. Qty: 30 tablet, Refills: 0    senna (SENOKOT) 8.6 MG TABS tablet Take 1 tablet  (8.6 mg total) by mouth 2 (two) times daily. Qty: 120 each, Refills: 0    venlafaxine XR (EFFEXOR-XR) 75 MG 24 hr capsule Take 1 capsule (75 mg total) by mouth daily with breakfast. Qty: 30 capsule, Refills: 0      CONTINUE these medications which have CHANGED   Details  lisinopril (PRINIVIL,ZESTRIL) 20 MG tablet Take 1 tablet (20 mg total) by mouth daily. Qty: 30 tablet, Refills: 0    metoprolol succinate (TOPROL-XL) 25 MG 24 hr tablet Take 2 tablets (50 mg total) by mouth daily. Qty: 30 tablet, Refills: 0      CONTINUE these medications which have NOT CHANGED   Details  aspirin EC 81 MG tablet Take 81 mg by mouth daily.    donepezil (ARICEPT) 5 MG tablet Take 5 mg by mouth daily.    hydrOXYzine (ATARAX/VISTARIL) 25 MG tablet Take 25 mg by mouth 3 (three) times daily as needed for anxiety.    isosorbide mononitrate (IMDUR) 120 MG 24 hr tablet Take 120 mg by mouth daily.    Melatonin 3 MG TABS Take 3 mg by mouth at bedtime as needed (for sleep).    prazosin (MINIPRESS) 2 MG capsule Take 2 mg by mouth daily.    rosuvastatin (CRESTOR) 10 MG tablet Take 10 mg by mouth daily.      STOP taking these medications     clopidogrel (PLAVIX) 75 MG tablet             Relevant Imaging Results:  Relevant Lab Results:   Additional Information  (SSN [id number])  Verta Ellenhristina E Sunkins, LCSW

## 2016-05-21 NOTE — Progress Notes (Signed)
CSW is aware that patient is from [facility]. [facility] CSW will continue to follow and assist.  Woodroe Modehristina Stepheny Canal, MSW, LCSW-A, LCAS-A Clinical Social Worker [phone]

## 2016-05-21 NOTE — Consult Note (Signed)
Reason for Consult: Congestive heart failure history failure atrial fibrillation Referring Physician: Dr. Meade Maw hospitalist  Thomas Watkins is an 70 y.o. male.  HPI: Patient presents with acute shortness of breath cough dyspnea rapid irregular atrial fibrillation. Patient has a long history of multiple medical problems usually followed at the [place] in [place] he had a previous short brief episode of atrial fibrillation but was treated conservatively with aspirin and Plavix. Patient complains of shortness of breath no history of diastolic dysfunction hypertension obesity chronic renal insufficiency obstructive sleep apnea mild dementia. With acute shortness of breath he was brought to the emergency room placed on BiPAP and had significant improvement in his symptoms he was treated with diuretics for hypoxemia fibrillation rate was controlled. Patient thousand unit off of BiPAP requests transferred to the [facility] so the currently awaiting transfer. Patient denies any significant chest pain no blackout spells syncope. 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  Past Medical History  Diagnosis Date  . CAD (coronary artery disease)     a. CABG 2000; b. multiple stents; c. cardiac cath 02/05/2015: occluded mLAD, patent mid to distal LAD after the 100% occlusion with free LIMA to the mid LAD,  patent mid LAD stent w/ mild ISR, patent proximal to mid LAD stent before 100% occlusion, LCx & RCA with mild diffuse disease. EF >55%. No intervention needed. Medical management. CP likely not cardiac.   . Diastolic dysfunction     a. echo 01/2015: EF 97-67%, diastolic dysfunction, mild LVH, mildly dilated LA, mild AS (possibly calcified), mild TR,   . HTN (hypertension)   . OSA (obstructive sleep apnea)   . Dementia   . Diabetes mellitus without complication ([place])   . CHF (congestive heart failure) ([place])     History reviewed. No  pertinent past surgical history.  History reviewed. No pertinent family history.  Social History:  reports that he has quit smoking. He does not have any smokeless tobacco history on file. His alcohol and drug histories are not on file.  Allergies:  Allergies  Allergen Reactions  . Atorvastatin Other (See Comments)    Joint swelling  . Duloxetine Hcl Rash  . Carbamazepine   . Lecithin   . Oxcarbazepine   . Tizanidine Hcl   . Yellow Dyes (Non-Tartrazine)     Yellow dye #5 specifically    Medications: I have reviewed the patient's current medications.  Results for orders placed or performed during the hospital encounter of 05/20/16 (from the past 48 hour(s))  Comprehensive metabolic panel     Status: Abnormal   Collection Time: 05/20/16  2:50 AM  Result Value Ref Range   Sodium 137 135 - 145 mmol/L   Potassium 4.1 3.5 - 5.1 mmol/L   Chloride 103 101 - 111 mmol/L   CO2 27 22 - 32 mmol/L   Glucose, Bld 154 (H) 65 - 99 mg/dL   BUN 12 6 - 20 mg/dL   Creatinine, Ser 0.76 0.61 - 1.24 mg/dL   Calcium 8.5 (L) 8.9 - 10.3 mg/dL   Total Protein 6.6 6.5 - 8.1 g/dL   Albumin 4.1 3.5 - 5.0 g/dL   AST 38 15 - 41 U/L   ALT 40 17 - 63 U/L   Alkaline Phosphatase 59 38 - 126 U/L   Total Bilirubin 0.6 0.3 - 1.2 mg/dL   GFR calc non Af Amer >60 >60 mL/min   GFR calc Af  Amer >60 >60 mL/min    Comment: (NOTE) The eGFR has been calculated using the CKD EPI equation. This calculation has not been validated in all clinical situations. eGFR's persistently <60 mL/min signify possible Chronic Kidney Disease.    Anion gap 7 5 - 15  CBC with Differential/Platelet     Status: None   Collection Time: 05/20/16  2:50 AM  Result Value Ref Range   WBC 9.0 3.8 - 10.6 K/uL   RBC 4.96 4.40 - 5.90 MIL/uL   Hemoglobin 15.5 13.0 - 18.0 g/dL   HCT 46.5 40.0 - 52.0 %   MCV 93.7 80.0 - 100.0 fL   MCH 31.3 26.0 - 34.0 pg   MCHC 33.4 32.0 - 36.0 g/dL   RDW 13.2 11.5 - 14.5 %   Platelets 174 150 - 440 K/uL    Neutrophils Relative % 62 %   Neutro Abs 5.6 1.4 - 6.5 K/uL   Lymphocytes Relative 28 %   Lymphs Abs 2.5 1.0 - 3.6 K/uL   Monocytes Relative 8 %   Monocytes Absolute 0.7 0.2 - 1.0 K/uL   Eosinophils Relative 1 %   Eosinophils Absolute 0.1 0 - 0.7 K/uL   Basophils Relative 1 %   Basophils Absolute 0.1 0 - 0.1 K/uL  Troponin I     Status: None   Collection Time: 05/20/16  2:50 AM  Result Value Ref Range   Troponin I <0.03 <0.031 ng/mL    Comment:        NO INDICATION OF MYOCARDIAL INJURY.   Brain natriuretic peptide     Status: None   Collection Time: 05/20/16  2:55 AM  Result Value Ref Range   B Natriuretic Peptide 74.0 0.0 - 100.0 pg/mL  Fibrin derivatives D-Dimer ([facility] only)     Status: Abnormal   Collection Time: 05/20/16  6:40 AM  Result Value Ref Range   Fibrin derivatives D-dimer ([facility]) 652 (H) 0 - 499    Comment: <> Exclusion of Venous Thromboembolism (VTE) - OUTPATIENTS ONLY        (Emergency Department or [place])             0-499 ng/ml (FEU)  : With a low to intermediate pretest                                        probability for VTE this test result                                        excludes the diagnosis of VTE.           > 499 ng/ml (FEU)  : VTE not excluded.  Additional work up                                   for VTE is required.   <>  Testing on Inpatients and Evaluation of Disseminated Intravascular        Coagulation ([facility])             Reference Range:   0-499 ng/ml (FEU)   Glucose, capillary     Status: Abnormal   Collection Time: 05/20/16  7:49 AM  Result Value Ref Range   Glucose-Capillary 177 (H) 65 -  99 mg/dL  MRSA PCR Screening     Status: None   Collection Time: 05/20/16  7:56 AM  Result Value Ref Range   MRSA by PCR NEGATIVE NEGATIVE    Comment:        The GeneXpert MRSA Assay (FDA approved for NASAL specimens only), is one component of a comprehensive MRSA colonization surveillance program. It is not intended to diagnose  MRSA infection nor to guide or monitor treatment for MRSA infections.   TSH     Status: Abnormal   Collection Time: 05/20/16  9:13 AM  Result Value Ref Range   TSH 4.869 (H) 0.350 - 4.500 uIU/mL  Troponin I     Status: None   Collection Time: 05/20/16  9:13 AM  Result Value Ref Range   Troponin I <0.03 <0.031 ng/mL    Comment:        NO INDICATION OF MYOCARDIAL INJURY.   Glucose, capillary     Status: Abnormal   Collection Time: 05/20/16 11:19 AM  Result Value Ref Range   Glucose-Capillary 221 (H) 65 - 99 mg/dL  Troponin I     Status: None   Collection Time: 05/20/16  2:11 PM  Result Value Ref Range   Troponin I <0.03 <0.031 ng/mL    Comment:        NO INDICATION OF MYOCARDIAL INJURY.   Glucose, capillary     Status: Abnormal   Collection Time: 05/20/16  2:53 PM  Result Value Ref Range   Glucose-Capillary 137 (H) 65 - 99 mg/dL  Glucose, capillary     Status: Abnormal   Collection Time: 05/20/16  5:04 PM  Result Value Ref Range   Glucose-Capillary 162 (H) 65 - 99 mg/dL  Troponin I     Status: None   Collection Time: 05/20/16  8:23 PM  Result Value Ref Range   Troponin I <0.03 <0.031 ng/mL    Comment:        NO INDICATION OF MYOCARDIAL INJURY.   Glucose, capillary     Status: Abnormal   Collection Time: 05/20/16  9:21 PM  Result Value Ref Range   Glucose-Capillary 177 (H) 65 - 99 mg/dL   Comment 1 Notify RN   Basic metabolic panel     Status: Abnormal   Collection Time: 05/21/16  4:04 AM  Result Value Ref Range   Sodium 136 135 - 145 mmol/L   Potassium 4.2 3.5 - 5.1 mmol/L   Chloride 100 (L) 101 - 111 mmol/L   CO2 28 22 - 32 mmol/L   Glucose, Bld 142 (H) 65 - 99 mg/dL   BUN 18 6 - 20 mg/dL   Creatinine, Ser 0.99 0.61 - 1.24 mg/dL   Calcium 8.3 (L) 8.9 - 10.3 mg/dL   GFR calc non Af Amer >60 >60 mL/min   GFR calc Af Amer >60 >60 mL/min    Comment: (NOTE) The eGFR has been calculated using the CKD EPI equation. This calculation has not been validated in  all clinical situations. eGFR's persistently <60 mL/min signify possible Chronic Kidney Disease.    Anion gap 8 5 - 15  TSH     Status: Abnormal   Collection Time: 05/21/16  4:04 AM  Result Value Ref Range   TSH 5.090 (H) 0.350 - 4.500 uIU/mL  CBC     Status: None   Collection Time: 05/21/16  4:04 AM  Result Value Ref Range   WBC 10.5 3.8 - 10.6 K/uL   RBC 4.57 4.40 -  5.90 MIL/uL   Hemoglobin 14.5 13.0 - 18.0 g/dL   HCT 43.3 40.0 - 52.0 %   MCV 94.7 80.0 - 100.0 fL   MCH 31.8 26.0 - 34.0 pg   MCHC 33.5 32.0 - 36.0 g/dL   RDW 13.3 11.5 - 14.5 %   Platelets 156 150 - 440 K/uL  Glucose, capillary     Status: Abnormal   Collection Time: 05/21/16  7:41 AM  Result Value Ref Range   Glucose-Capillary 176 (H) 65 - 99 mg/dL  Glucose, capillary     Status: Abnormal   Collection Time: 05/21/16 11:47 AM  Result Value Ref Range   Glucose-Capillary 129 (H) 65 - 99 mg/dL   Comment 1 Notify RN     Ct Angio Chest Pe W Or Wo Contrast  05/21/2016  CLINICAL DATA:  70 year old male with severe diaphoresis and flushing. Wheezing and crackling bilaterally in the lungs. EXAM: CT ANGIOGRAPHY CHEST WITH CONTRAST TECHNIQUE: Multidetector CT imaging of the chest was performed using the standard protocol during bolus administration of intravenous contrast. Multiplanar CT image reconstructions and MIPs were obtained to evaluate the vascular anatomy. CONTRAST:  100 mL of Isovue 370. COMPARISON:  No priors. FINDINGS: Mediastinum/Lymph Nodes: There are no filling defects within the pulmonary arterial tree to suggest underlying pulmonary embolism. Heart size is normal. There is no significant pericardial fluid, thickening or pericardial calcification. There is aortic atherosclerosis, as well as atherosclerosis of the great vessels of the mediastinum and the coronary arteries, including calcified atherosclerotic plaque in the left main, left anterior descending, left circumflex and right coronary arteries. Status post  median sternotomy for CABG. Severe calcifications of the aortic valve, particularly the non coronary cusp. Multiple prominent borderline enlarged mediastinal and bilateral hilar lymph nodes are nonspecific. Esophagus is unremarkable in appearance. No axillary lymphadenopathy. Lungs/Pleura: Tiny pulmonary nodules in the right middle lobe and right lower lobe, measuring up to 4 mm in the subpleural aspect of the right lower lobe (image 92 of series 6) are highly nonspecific. No other larger more suspicious appearing pulmonary nodules or masses are noted. No acute consolidative airspace disease. No pleural effusions. Upper Abdomen: Diffuse low attenuation throughout the hepatic parenchyma, compatible with severe hepatic steatosis. Musculoskeletal/Soft Tissues: Median sternotomy wires. Spinal cord stimulator in place overlying the mid to lower thoracic spine. There are no aggressive appearing lytic or blastic lesions noted in the visualized portions of the skeleton. Review of the MIP images confirms the above findings. IMPRESSION: 1. No evidence of pulmonary embolism. 2. No acute findings in the thorax to account for the patient's symptoms. 3. Aortic atherosclerosis, in addition to left main and 3 vessel coronary artery disease. Status post median sternotomy for CABG. 4. There are calcifications of the aortic valve. Echocardiographic correlation for evaluation of potential valvular dysfunction may be warranted if clinically indicated. 5. Severe hepatic steatosis. 6. Tiny pulmonary nodules in the right lung measuring 4 mm or less in size. These are nonspecific, but statistically likely benign. No follow-up needed if patient is low-risk (and has no known or suspected primary neoplasm). Non-contrast chest CT can be considered in 12 months if patient is high-risk. This recommendation follows the consensus statement: Guidelines for Management of Incidental Pulmonary Nodules Detected on CT Images:From the Fleischner Society  2017; published online before print (10.1148/radiol.1610960454). Electronically Signed   By: Vinnie Langton M.D.   On: 05/21/2016 11:15   Dg Chest Port 1 View  05/20/2016  CLINICAL DATA:  70 year old male with shortness of breath.  EXAM: PORTABLE CHEST 1 VIEW COMPARISON:  Chest radiograph dated 02/06/2015 FINDINGS: Single portable view of the chest demonstrates left lung base atelectatic changes versus less likely infiltrate. The right lung is clear. There is no pleural effusion or pneumothorax. Stable cardiac silhouette. Median sternotomy wires. No acute osseous pathology. IMPRESSION: Left lung base atelectasis versus less likely infiltrate. Electronically Signed   By: Anner Crete M.D.   On: 05/20/2016 03:09    Review of Systems  Constitutional: Positive for malaise/fatigue.  HENT: Positive for congestion.   Eyes: Negative.   Respiratory: Positive for shortness of breath.   Cardiovascular: Positive for palpitations and leg swelling.  Gastrointestinal: Negative.   Genitourinary: Negative.   Musculoskeletal: Negative.   Skin: Negative.   Neurological: Positive for weakness.  Endo/Heme/Allergies: Negative.   Psychiatric/Behavioral: Negative.    Blood pressure 109/50, pulse 64, temperature 98.3 F (36.8 C), temperature source Oral, resp. rate 20, height 6' (1.829 m), weight 144.561 kg (318 lb 11.2 oz), SpO2 95 %. Physical Exam  Nursing note and vitals reviewed. Constitutional: He is oriented to person, place, and time. He appears well-developed and well-nourished.  HENT:  Head: Normocephalic and atraumatic.  Eyes: Conjunctivae and EOM are normal. Pupils are equal, round, and reactive to light.  Neck: Normal range of motion. Neck supple.  Cardiovascular: S1 normal, S2 normal and normal pulses.  An irregularly irregular rhythm present. Frequent extrasystoles are present. Tachycardia present.   Murmur heard.  Systolic murmur is present with a grade of 2/6  Respiratory: Effort normal.  He has wheezes.  GI: Soft. Bowel sounds are normal.  Musculoskeletal: He exhibits edema.  Neurological: He is alert and oriented to person, place, and time. He has normal reflexes.  Skin: Skin is warm and dry.  Psychiatric: He has a normal mood and affect.    Assessment/Plan: Atrial fibrillation paroxysmal Tachycardia Coronary artery disease Hypertension Obesity Chronic renal insufficiency Obstructive sleep apnea Diastolic dysfunction Respiratory failure Dementia mild Bilateral leg edema Coronary bypass surgery history . PLAN Agree with ICU level care Continue respiratory support BiPAP as well as inhalers Recommend diuretic therapy Rate control for atrial fibrillation Strongly consider long-term anticoagulation Possible bronchitis continuous telemetry support as per pulmonary consider steroid therapy Known coronary disease continue current medications currently on aspirin and Plavix Obesity recommend weight loss exercise portion control Chronic renal insufficiency the patient follow-up with nephrology We will try to transfer the patient to the [facility] when bed available  Akeen Ledyard D. 05/21/2016, 1:08 PM

## 2016-05-21 NOTE — Progress Notes (Signed)
Pt wife called RN into room stating pt was sweating profusely. Upon entering room, pt is flushed and diaphoretic. BP taken, 70s-80s SBP in bilat. Arms, 100s SBP in legs. Pt has wheezing and crackles bilaterally on auscultation. Pt otherwise does not complain of shortness of breath, dizziness or lightheadedness, just that he's "stopped up." Dr. Amado CoeGouru paged and made aware, has placed orders for xopenex and CTA chest to r/o PE. Will assess BP regularly and continue to monitor closely.

## 2016-05-21 NOTE — Progress Notes (Signed)
Per pt wife, Dr. Juliann Paresallwood has not rounded yet today and she was eager to hear from him. Dr. Juliann Paresallwood paged around 1800 approximately, no callback yet.

## 2016-05-21 NOTE — Progress Notes (Signed)
Patient has no acute event overnight. He remained on Diltiazem infusion, and maintained HR in the 70-80 with occasional  110-120. Wife stated she [facility] [place] [facility]. PRN pain med was given for chronic back pain,

## 2016-05-21 NOTE — Progress Notes (Signed)
SATURATION QUALIFICATIONS: (This note is used to comply with regulatory documentation for home oxygen)  Patient Saturations on Room Air at Rest = 88%  Patient Saturations on Room Air while Ambulating = n/a%  Patient Saturations on n/a Liters of oxygen while Ambulating = n/a%  Please briefly explain why patient needs home oxygen: 

## 2016-05-21 NOTE — Clinical Social Work Note (Signed)
Clinical Social Work Assessment  Patient Details  Name: Thomas Watkins MRN: [id number] Date of Birth: [birth year]  Date of referral:  05/21/16               Reason for consult:  Discharge Planning                Permission sought to share information with:  Family Supports Permission granted to share information::  Yes, Verbal Permission Granted  Name::        Agency::     Relationship::   Thomas Watkins- Wife)  Contact Information:     Housing/Transportation Living arrangements for the past 2 months:  [place] ([place]) Source of Information:  Spouse Patient Interpreter Needed:  None Criminal Activity/Legal Involvement Pertinent to Current Situation/Hospitalization:  No - Comment as needed Significant Relationships:  Other Family Members, Spouse Lives with:  Facility Resident Do you feel safe going back to the place where you live?  Yes Need for family participation in patient care:  No ([place]) Thomas Watkins- Wife)  Care giving concerns:  Patient is a resident at [facility] Worker assessment / plan:  CSW met with patient and his wife at bedside. CSW introduced herself and her role. Per patient's wife she'd like patient to be transferred to [place], [place]. Stated that she understands that patient would have to be medically stable in order to transfer to the [place]. CSW explained that patient may become medically stable for discharge before [facility] transfer is executed. Patient's wife reports that she understands. Reported that patient is from [place]. Reported that patient will return there at discharge. Stated that she's very familiar with the staff at [facility]. Stated they are like family. [facility] staff entered the room during assessment. Verbal permission was granted for CSW to coordinate discharge with Nanine Means if patient isn't transferred to the [place]. FL2/ PASRR completed and is pending until discharge due to CSW having to  update discharge medications manually. CSW will continue to follow and assist.   Employment status:  Retired Forensic scientist:  Medicare PT Recommendations:  Not assessed at this time Information / Referral to community resources:     Patient/Family's Response to care:  Patient's wife would like for patient to be transferred to the [place] but in the event that it does not take place she's agreeable for patient to return to [place] at discharge.   Patient/Family's Understanding of and Emotional Response to Diagnosis, Current Treatment, and Prognosis:  Patient stated that he's sick and that's why he's at [facility]. Stated the appreciate CSW's assistance with discharge plans coordination.   Emotional Assessment Appearance:  Appears stated age Attitude/Demeanor/Rapport:   (None) Affect (typically observed):  Calm, Pleasant Orientation:  Oriented to Self, Oriented to Place, Oriented to  Time, Oriented to Situation Alcohol / Substance use:  Not Applicable Psych involvement (Current and /or in the community):  No (Comment)  Discharge Needs  Concerns to be addressed:  Discharge Planning Concerns Readmission within the last 30 days:  No Current discharge risk:  Chronically ill Barriers to Discharge:  Continued Medical Work up   [employer], LCSW 05/21/2016, 3:56 PM

## 2016-05-21 NOTE — Progress Notes (Signed)
[facility] - [place] at [facility]   PATIENT NAME: Thomas Watkins    MR#:  [id number]  DATE OF BIRTH:  14-Sep-1946  SUBJECTIVE:  CHIEF COMPLAINT:  Patient's shortness of breath is better. Palpitations  improved   REVIEW OF SYSTEMS:  CONSTITUTIONAL: No fever, fatigue or weakness.  EYES: No blurred or double vision.  EARS, NOSE, AND THROAT: No tinnitus or ear pain.  RESPIRATORY: No cough, Improving shortness of breath, denies wheezing or hemoptysis.  CARDIOVASCULAR: No chest pain, orthopnea, edema.  GASTROINTESTINAL: No nausea, vomiting, diarrhea or abdominal pain.  GENITOURINARY: No dysuria, hematuria.  ENDOCRINE: No polyuria, nocturia,  HEMATOLOGY: No anemia, easy bruising or bleeding SKIN: No rash or lesion. MUSCULOSKELETAL: No joint pain or arthritis.  Reporting feet swelling NEUROLOGIC: No tingling, numbness, weakness.  PSYCHIATRY: No anxiety or depression.   DRUG ALLERGIES:   Allergies  Allergen Reactions  . Atorvastatin Other (See Comments)    Joint swelling  . Duloxetine Hcl Rash  . Carbamazepine   . Lecithin   . Oxcarbazepine   . Tizanidine Hcl   . Yellow Dyes (Non-Tartrazine)     Yellow dye #5 specifically    VITALS:  Blood pressure 114/70, pulse 66, temperature 98.4 F (36.9 C), temperature source Oral, resp. rate 20, height 6' (1.829 m), weight 144.561 kg (318 lb 11.2 oz), SpO2 90 %.  PHYSICAL EXAMINATION:  GENERAL:  70 y.o.-year-old patient lying in the bed with no acute distress.  EYES: Pupils equal, round, reactive to light and accommodation. No scleral icterus. Extraocular muscles intact.  HEENT: Head atraumatic, normocephalic. Oropharynx and nasopharynx clear.  NECK:  Supple, no jugular venous distention. No thyroid enlargement, no tenderness.  LUNGS: Moderate breath sounds bilaterally, no wheezing, rales,rhonchi or crepitation. No use of accessory muscles of respiration.  CARDIOVASCULAR:Irregularly irregular No murmurs, rubs,  or gallops.  ABDOMEN: Soft, nontender, nondistended. Bowel sounds present. No organomegaly or mass.  EXTREMITIES: 2+ pitting pedal edema, no cyanosis, or clubbing.  NEUROLOGIC: Cranial nerves II through XII are intact. Muscle strength 5/5 in all extremities. Sensation intact. Gait not checked.  PSYCHIATRIC: The patient is alert and oriented x 3.  SKIN: No obvious rash, lesion, or ulcer.    LABORATORY PANEL:   CBC  Recent Labs Lab 05/21/16 0404  WBC 10.5  HGB 14.5  HCT 43.3  PLT 156   ------------------------------------------------------------------------------------------------------------------  Chemistries   Recent Labs Lab 05/20/16 0250 05/21/16 0404  NA 137 136  K 4.1 4.2  CL 103 100*  CO2 27 28  GLUCOSE 154* 142*  BUN 12 18  CREATININE 0.76 0.99  CALCIUM 8.5* 8.3*  AST 38  --   ALT 40  --   ALKPHOS 59  --   BILITOT 0.6  --    ------------------------------------------------------------------------------------------------------------------  Cardiac Enzymes  Recent Labs Lab 05/20/16 2023  TROPONINI <0.03   ------------------------------------------------------------------------------------------------------------------  RADIOLOGY:  Ct Angio Chest Pe W Or Wo Contrast  05/21/2016  CLINICAL DATA:  70 year old male with severe diaphoresis and flushing. Wheezing and crackling bilaterally in the lungs. EXAM: CT ANGIOGRAPHY CHEST WITH CONTRAST TECHNIQUE: Multidetector CT imaging of the chest was performed using the standard protocol during bolus administration of intravenous contrast. Multiplanar CT image reconstructions and MIPs were obtained to evaluate the vascular anatomy. CONTRAST:  100 mL of Isovue 370. COMPARISON:  No priors. FINDINGS: Mediastinum/Lymph Nodes: There are no filling defects within the pulmonary arterial tree to suggest underlying pulmonary embolism. Heart size is normal. There is no significant pericardial fluid, thickening or  pericardial  calcification. There is aortic atherosclerosis, as well as atherosclerosis of the great vessels of the mediastinum and the coronary arteries, including calcified atherosclerotic plaque in the left main, left anterior descending, left circumflex and right coronary arteries. Status post median sternotomy for CABG. Severe calcifications of the aortic valve, particularly the non coronary cusp. Multiple prominent borderline enlarged mediastinal and bilateral hilar lymph nodes are nonspecific. Esophagus is unremarkable in appearance. No axillary lymphadenopathy. Lungs/Pleura: Tiny pulmonary nodules in the right middle lobe and right lower lobe, measuring up to 4 mm in the subpleural aspect of the right lower lobe (image 92 of series 6) are highly nonspecific. No other larger more suspicious appearing pulmonary nodules or masses are noted. No acute consolidative airspace disease. No pleural effusions. Upper Abdomen: Diffuse low attenuation throughout the hepatic parenchyma, compatible with severe hepatic steatosis. Musculoskeletal/Soft Tissues: Median sternotomy wires. Spinal cord stimulator in place overlying the mid to lower thoracic spine. There are no aggressive appearing lytic or blastic lesions noted in the visualized portions of the skeleton. Review of the MIP images confirms the above findings. IMPRESSION: 1. No evidence of pulmonary embolism. 2. No acute findings in the thorax to account for the patient's symptoms. 3. Aortic atherosclerosis, in addition to left main and 3 vessel coronary artery disease. Status post median sternotomy for CABG. 4. There are calcifications of the aortic valve. Echocardiographic correlation for evaluation of potential valvular dysfunction may be warranted if clinically indicated. 5. Severe hepatic steatosis. 6. Tiny pulmonary nodules in the right lung measuring 4 mm or less in size. These are nonspecific, but statistically likely benign. No follow-up needed if patient is low-risk (and  has no known or suspected primary neoplasm). Non-contrast chest CT can be considered in 12 months if patient is high-risk. This recommendation follows the consensus statement: Guidelines for Management of Incidental Pulmonary Nodules Detected on CT Images:From the Fleischner Society 2017; published online before print (10.1148/radiol.[id number]). Electronically Signed   By: Trudie Reedaniel  Entrikin M.D.   On: 05/21/2016 11:15   Dg Chest Port 1 View  05/20/2016  CLINICAL DATA:  70 year old male with shortness of breath. EXAM: PORTABLE CHEST 1 VIEW COMPARISON:  Chest radiograph dated 02/06/2015 FINDINGS: Single portable view of the chest demonstrates left lung base atelectatic changes versus less likely infiltrate. The right lung is clear. There is no pleural effusion or pneumothorax. Stable cardiac silhouette. Median sternotomy wires. No acute osseous pathology. IMPRESSION: Left lung base atelectasis versus less likely infiltrate. Electronically Signed   By: Elgie CollardArash  Radparvar M.D.   On: 05/20/2016 03:09    EKG:   Orders placed or performed in visit on 05/20/16  . EKG 12-Lead  . EKG 12-Lead  . EKG 12-Lead    ASSESSMENT AND PLAN:   1.Acute Respiratory Failure: Likely combination of CHF brought on by a-fib with RVR and bronchitis.  Pt is  off bipap but is still requiring 4L oxygen. Will continue oxygen support. If worsens will place back on bipap.   on full dose anticoagulation. CT angiogram of the chest is negative for pulmonary embolism   2. Atrial Fib./ Atrial Flutter with RVR: New onset- Continue  cardizem drip to control heart rate, wean to oral medication Cardizem by mouth 30 mg by mouth every 6 hours Anticoagulate with Eliquis,Discontinued Plavix  Echo to evaluate heart is done report is pending P[facility]. He is followed by cardiologist in [place], Dr Abran CantorJacoby.  3. CHF: Has history of dyastolic CHF.  CXR not very impressive and BNP  not elevated  has received 2 doses of IV  Lasix Monitor daily weights and intake and output Not on diuretics at home  4. Acute Bronchitis: IV rocephin.  5. CAD: continue aspirin 81 mg,  discontinuedPlavix 75 mgas patient is on eliquis  6. Pulmonary nodules repeat CAT scan of the chest in 6 months to 1 year    7. Elevated TSH-check free T4 and T3 in a.m.  Transfer patient to do her home Hospital when bed is available. Discussed with Dr. Si Gaul and Dr. Lysbeth Penner at [facility], will consider taking the patient once he is off of Cardizem drip      All the records are reviewed and case discussed with Care Management/Social Workerr. Management plans discussed with the patient, family and they are in agreement.  CODE STATUS: DNR  TOTAL CRITICAL CARE TIME TAKING CARE OF THIS PATIENT: .   POSSIBLE D/C IN 2-3 DAYS, DEPENDING ON CLINICAL CONDITION.  Note: This dictation was prepared with Dragon dictation along with smaller phrase technology. Any transcriptional errors that result from this process are unintentional.   Ramonita Lab M.D on 05/21/2016 at 4:58 PM  Between 7am to 6pm - Pager - [phone] After 6pm go to [email or web address] - password [email or web address] [facility]  [place] [facility]  Office  [phone]  CC: Primary care physician; No primary care provider on file.

## 2016-05-21 NOTE — Care Management (Signed)
Patient experienced diaphoresis drop in blood pressure. Chest CT ordered.  Discussed need for assessment of need for home 02 and rationale of need to pursue early due to need for VA approval which is not always timely.  Again there is no evidence that a [facility] has been initiated. Obtained signatures from attending and faxed transfer information to [employer]

## 2016-05-22 ENCOUNTER — Inpatient Hospital Stay: Payer: Non-veteran care

## 2016-05-22 LAB — ECHOCARDIOGRAM COMPLETE
AOVTI: 30.5 cm
AV area mean vel ind: 0.8 cm2/m2
AVAREAMEANV: 2.21 cm2
AVAREAVTI: 2.03 cm2
AVAREAVTIIND: 1.05 cm2/m2
AVCELMEANRAT: 0.53
AVG: 10 mmHg
AVPG: 16 mmHg
AVPKVEL: 197 cm/s
Ao pk vel: 0.49 m/s
CHL CUP AV PEAK INDEX: 0.73
CHL CUP AV VALUE AREA INDEX: 1.05
CHL CUP AV VEL: 2.9
CHL CUP MV DEC (S): 215
DOP CAL AO MEAN VELOCITY: 147 cm/s
EWDT: 215 ms
FS: 22 % — AB (ref 28–44)
Height: 72 in
IVS/LV PW RATIO, ED: 1.57
LA diam index: 1.7 cm/m2
LA vol A4C: 56.5 ml
LA vol index: 22.6 mL/m2
LA vol: 62.6 mL
LASIZE: 47 mm
LEFT ATRIUM END SYS DIAM: 47 mm
LVOT VTI: 21.3 cm
LVOT area: 4.15 cm2
LVOT diameter: 23 mm
LVOT peak vel: 96.4 cm/s
LVOTSV: 88 mL
LVOTVTI: 0.7 cm
MV Peak grad: 5 mmHg
MV pk E vel: 107 m/s
PW: 10.8 mm — AB (ref 0.6–1.1)
Valve area: 2.9 cm2
Weight: 5068.82 oz

## 2016-05-22 LAB — GLUCOSE, CAPILLARY
GLUCOSE-CAPILLARY: 147 mg/dL — AB (ref 65–99)
GLUCOSE-CAPILLARY: 141 mg/dL — AB (ref 65–99)
GLUCOSE-CAPILLARY: 137 mg/dL — AB (ref 65–99)

## 2016-05-22 LAB — BASIC METABOLIC PANEL
ANION GAP: 7 (ref 5–15)
BUN: 15 mg/dL (ref 6–20)
CHLORIDE: 99 mmol/L — AB (ref 101–111)
CO2: 31 mmol/L (ref 22–32)
Calcium: 8.7 mg/dL — ABNORMAL LOW (ref 8.9–10.3)
Creatinine, Ser: 0.86 mg/dL (ref 0.61–1.24)
GFR calc Af Amer: >60 mL/min (ref >60)
GFR calc non Af Amer: >60 mL/min (ref >60)
Glucose, Bld: 153 mg/dL — ABNORMAL HIGH (ref 65–99)
POTASSIUM: 4.3 mmol/L (ref 3.5–5.1)
SODIUM: 137 mmol/L (ref 135–145)

## 2016-05-22 LAB — T4, FREE: Free T4: 0.88 ng/dL (ref 0.61–1.12)

## 2016-05-22 MED ORDER — FUROSEMIDE 10 MG/ML IJ SOLN
20.0000 mg | Freq: Two times a day (BID) | INTRAMUSCULAR | Status: DC
  Administered 2016-05-22 (×2): 20 mg via INTRAVENOUS
  Filled 2016-05-22 (×3): qty 2

## 2016-05-22 NOTE — Evaluation (Signed)
Physical Therapy Evaluation Patient Details Name: Thomas LarocheHerman Gaither Watkins MRN: [id number] DOB: 11/30/1946 Today's Date: 05/22/2016   History of Present Illness  Pt admitted for afib and aflutter. Pt with complaints of cough and SOB-placed on bipap in ED. Pt currently on 2L of O2 at this time. Pt with history of CAD, HTN, and dementia. [facility]. Pt being weaned off Cardizem drip, HR stable. Cleared to work with therapy.  Clinical Impression  Pt is a pleasant 70 year old male who was admitted for afib/aflutter. Pt performs bed mobility with cga, transfers with supervision, and ambulation with cga and rw. O2 donned with sats at 94% with exertion and 96% with exertion on room air. Pt reports no SOB symptoms. Pt demonstrates deficits with strength/endurance. Pt is very close to baseline level. Would benefit from skilled PT to address above deficits and promote optimal return to PLOF.      Follow Up Recommendations Home health PT (return back to memory care)    Equipment Recommendations       Recommendations for Other Services       Precautions / Restrictions Precautions Precautions: Fall Restrictions Weight Bearing Restrictions: No      Mobility  Bed Mobility Overal bed mobility: Needs Assistance Bed Mobility: Supine to Sit     Supine to sit: Min guard     General bed mobility comments: safe technique performed. Once seated at EOB, pt able to sit with supervision. Min assist for transfer back to bed.  Transfers Overall transfer level: Needs assistance Equipment used: Rolling walker (2 wheeled) Transfers: Sit to/from Stand Sit to Stand: Supervision         General transfer comment: safe technique performed. Shoes donned prior to transfer. RW used  Ambulation/Gait Ambulation/Gait assistance: Min Government social research officerguard Ambulation Distance (Feet): 80 Feet Assistive device: Rolling walker (2 wheeled) Gait Pattern/deviations: Step-through pattern      General Gait Details: ambulated 2 x 40' once with 2L of O2 applied and once on RA. Pt with sats WNL and no SOB. Pt with reciprocal gait pattern and safe technique  Stairs            Wheelchair Mobility    Modified Rankin (Stroke Patients Only)       Balance Overall balance assessment: Needs assistance Sitting-balance support: Feet supported Sitting balance-Leahy Scale: Normal     Standing balance support: Bilateral upper extremity supported Standing balance-Leahy Scale: Good                               Pertinent Vitals/Pain Pain Assessment: No/denies pain    Home Living Family/patient expects to be discharged to:: Assisted living               Home Equipment: Walker - 2 wheels;Cane - single point (lift chair) Additional Comments: [facility]    Prior Function Level of Independence: Independent with assistive device(s)         Comments: household ambulator with RW for long distances and SPC for short distances     Hand Dominance        Extremity/Trunk Assessment   Upper Extremity Assessment: Overall WFL for tasks assessed           Lower Extremity Assessment: Generalized weakness (B LE grossly 4/5)         Communication   Communication: No difficulties  Cognition Arousal/Alertness: Awake/alert Behavior During Therapy: WFL for  tasks assessed/performed Overall Cognitive Status: Within Functional Limits for tasks assessed                      General Comments      Exercises Other Exercises Other Exercises: Seated ther-ex performed on B LE including LAQ, hip add squeezes, and ankle pumps. All ther-ex performed x 10 reps with supervision and correct technique      Assessment/Plan    PT Assessment Patient needs continued PT services  PT Diagnosis Difficulty walking;Generalized weakness   PT Problem List Decreased strength;Decreased activity tolerance  PT Treatment Interventions DME  instruction;Gait training;Therapeutic exercise   PT Goals (Current goals can be found in the Care Plan section) Acute Rehab PT Goals Patient Stated Goal: to go home PT Goal Formulation: With patient Time For Goal Achievement: 06/05/16 Potential to Achieve Goals: Good    Frequency Min 2X/week   Barriers to discharge        Co-evaluation               End of Session Equipment Utilized During Treatment: Gait belt;Oxygen Activity Tolerance: Patient tolerated treatment well Patient left: in bed;with call bell/phone within reach;with family/visitor present;with bed alarm set Nurse Communication: Mobility status         Time: [id number] PT Time Calculation (min) (ACUTE ONLY): 28 min   Charges:   PT Evaluation $PT Eval Moderate Complexity: 1 Procedure PT Treatments $Therapeutic Exercise: 8-22 mins   PT G Codes:        Quantisha Marsicano 05/22/2016, 12:09 PM  Elizabeth PalauStephanie Izayiah Tibbitts, PT, DPT [phone]

## 2016-05-22 NOTE — Care Management (Signed)
Physical therapy is recommending home health physical therapy at discharge.  CM has made 6 calls to [facility] to discuss patient returning and need for home health and no response to call when call transferred.  Updated CSW.  Patient is not going to require home 02. Spoke with attending about home health SN and PT.  [facility]  Will fax discharge summary when it is available

## 2016-05-22 NOTE — Progress Notes (Signed)
Pt in bed AAOX3 voice no complaints. No distress noted.  Wife at bedside. Noted two little size scabs to left arm, wife states pt had this over a year cause he likes to pull on scabs.

## 2016-05-22 NOTE — Progress Notes (Signed)
[facility] - [place] [facility]   PATIENT NAME: Thomas Watkins    MR#:  [id number]  DATE OF BIRTH:  [birth year]  SUBJECTIVE:  CHIEF COMPLAINT:  Patient's shortness of breath is better. Palpitations  Improved but wheezing today   REVIEW OF SYSTEMS:  CONSTITUTIONAL: No fever, fatigue or weakness.  EYES: No blurred or double vision.  EARS, NOSE, AND THROAT: No tinnitus or ear pain.  RESPIRATORY: No cough, Improving shortness of breath, denies wheezing or hemoptysis.  CARDIOVASCULAR: No chest pain, orthopnea, edema.  GASTROINTESTINAL: No nausea, vomiting, diarrhea or abdominal pain.  GENITOURINARY: No dysuria, hematuria.  ENDOCRINE: No polyuria, nocturia,  HEMATOLOGY: No anemia, easy bruising or bleeding SKIN: No rash or lesion. MUSCULOSKELETAL: No joint pain or arthritis.  Reporting feet swelling NEUROLOGIC: No tingling, numbness, weakness.  PSYCHIATRY: No anxiety or depression.   DRUG ALLERGIES:   Allergies  Allergen Reactions  . Atorvastatin Other (See Comments)    Joint swelling  . Duloxetine Hcl Rash  . Carbamazepine   . Lecithin   . Oxcarbazepine   . Tizanidine Hcl   . Yellow Dyes (Non-Tartrazine)     Yellow dye #5 specifically    VITALS:  Blood pressure 129/63, pulse 78, temperature 98.8 F (37.1 C), temperature source Oral, resp. rate 18, height 6' (1.829 m), weight 144.38 kg (318 lb 4.8 oz), SpO2 93 %.  PHYSICAL EXAMINATION:  GENERAL:  70 y.o.-year-old patient lying in the bed with no acute distress.  EYES: Pupils equal, round, reactive to light and accommodation. No scleral icterus. Extraocular muscles intact.  HEENT: Head atraumatic, normocephalic. Oropharynx and nasopharynx clear.  NECK:  Supple, no jugular venous distention. No thyroid enlargement, no tenderness.  LUNGS: Moderate breath sounds bilaterally, min wheezing, no rales,rhonchi or crepitation. No use of accessory muscles of respiration.  CARDIOVASCULAR:Irregularly  irregular No murmurs, rubs, or gallops.  ABDOMEN: Soft, nontender, nondistended. Bowel sounds present. No organomegaly or mass.  EXTREMITIES: 1+ pitting pedal edema, no cyanosis, or clubbing.  NEUROLOGIC: Cranial nerves II through XII are intact. Muscle strength 5/5 in all extremities. Sensation intact. Gait not checked.  PSYCHIATRIC: The patient is alert and oriented x 3.  SKIN: No obvious rash, lesion, or ulcer.    LABORATORY PANEL:   CBC  Recent Labs Lab 05/21/16 0404  WBC 10.5  HGB 14.5  HCT 43.3  PLT 156   ------------------------------------------------------------------------------------------------------------------  Chemistries   Recent Labs Lab 05/20/16 0250  05/22/16 0457  NA 137  < > 137  K 4.1  < > 4.3  CL 103  < > 99*  CO2 27  < > 31  GLUCOSE 154*  < > 153*  BUN 12  < > 15  CREATININE 0.76  < > 0.86  CALCIUM 8.5*  < > 8.7*  AST 38  --   --   ALT 40  --   --   ALKPHOS 59  --   --   BILITOT 0.6  --   --   < > = values in this interval not displayed. ------------------------------------------------------------------------------------------------------------------  Cardiac Enzymes  Recent Labs Lab 05/20/16 2023  TROPONINI <0.03   ------------------------------------------------------------------------------------------------------------------  RADIOLOGY:  Dg Chest 2 View  05/22/2016  CLINICAL DATA:  Productive cough, congestion, shortness of Breath EXAM: CHEST  2 VIEW COMPARISON:  05/20/2016 FINDINGS: Cardiomediastinal silhouette is unremarkable. No acute infiltrate or pleural effusion. No pulmonary edema. Bony thorax is unremarkable. Mild infrahilar bronchitic changes. There is a spinal wire stimulator in thoracic spine. Status post median  sternotomy. IMPRESSION: No infiltrate or pulmonary edema. Mild infrahilar bronchitic changes. Electronically Signed   By: Natasha MeadLiviu  Pop M.D.   On: 05/22/2016 14:11   Ct Angio Chest Pe W Or Wo Contrast  05/21/2016   CLINICAL DATA:  70 year old male with severe diaphoresis and flushing. Wheezing and crackling bilaterally in the lungs. EXAM: CT ANGIOGRAPHY CHEST WITH CONTRAST TECHNIQUE: Multidetector CT imaging of the chest was performed using the standard protocol during bolus administration of intravenous contrast. Multiplanar CT image reconstructions and MIPs were obtained to evaluate the vascular anatomy. CONTRAST:  100 mL of Isovue 370. COMPARISON:  No priors. FINDINGS: Mediastinum/Lymph Nodes: There are no filling defects within the pulmonary arterial tree to suggest underlying pulmonary embolism. Heart size is normal. There is no significant pericardial fluid, thickening or pericardial calcification. There is aortic atherosclerosis, as well as atherosclerosis of the great vessels of the mediastinum and the coronary arteries, including calcified atherosclerotic plaque in the left main, left anterior descending, left circumflex and right coronary arteries. Status post median sternotomy for CABG. Severe calcifications of the aortic valve, particularly the non coronary cusp. Multiple prominent borderline enlarged mediastinal and bilateral hilar lymph nodes are nonspecific. Esophagus is unremarkable in appearance. No axillary lymphadenopathy. Lungs/Pleura: Tiny pulmonary nodules in the right middle lobe and right lower lobe, measuring up to 4 mm in the subpleural aspect of the right lower lobe (image 92 of series 6) are highly nonspecific. No other larger more suspicious appearing pulmonary nodules or masses are noted. No acute consolidative airspace disease. No pleural effusions. Upper Abdomen: Diffuse low attenuation throughout the hepatic parenchyma, compatible with severe hepatic steatosis. Musculoskeletal/Soft Tissues: Median sternotomy wires. Spinal cord stimulator in place overlying the mid to lower thoracic spine. There are no aggressive appearing lytic or blastic lesions noted in the visualized portions of the  skeleton. Review of the MIP images confirms the above findings. IMPRESSION: 1. No evidence of pulmonary embolism. 2. No acute findings in the thorax to account for the patient's symptoms. 3. Aortic atherosclerosis, in addition to left main and 3 vessel coronary artery disease. Status post median sternotomy for CABG. 4. There are calcifications of the aortic valve. Echocardiographic correlation for evaluation of potential valvular dysfunction may be warranted if clinically indicated. 5. Severe hepatic steatosis. 6. Tiny pulmonary nodules in the right lung measuring 4 mm or less in size. These are nonspecific, but statistically likely benign. No follow-up needed if patient is low-risk (and has no known or suspected primary neoplasm). Non-contrast chest CT can be considered in 12 months if patient is high-risk. This recommendation follows the consensus statement: Guidelines for Management of Incidental Pulmonary Nodules Detected on CT Images:From the Fleischner Society 2017; published online before print (10.1148/[id number] [id number]). Electronically Signed   By: Trudie Reedaniel  Entrikin M.D.   On: 05/21/2016 11:15    EKG:   Orders placed or performed in visit on 05/20/16  . EKG 12-Lead  . EKG 12-Lead  . EKG 12-Lead    ASSESSMENT AND PLAN:   1.Acute Respiratory Failure: Likely combination of CHF brought on by a-fib with RVR and bronchitis.  Pt is  off bipap but is still requiring 4L oxygen. Wean off o2  . on full dose anticoagulation. CT angiogram of the chest is negative for pulmonary embolism cxr nml  2. Atrial Fib./ Atrial Flutter with RVR: New onset- Continue  cardizem drip to control heart rate, wean to oral medication Cardizem by mouth 30 mg by mouth every 6 hours Anticoagulate with Eliquis,Discontinued Plavix  Echo 55-60 %  Appreciate  [facility].  He is followed by cardiologist in [place], Dr Gerilyn PilgrimJacob.  3. CHF: Has history of dyastolic CHF.  CXR not very impressive and BNP not elevated   has received 2 doses of IV Lasix Monitor daily weights and intake and output Not on diuretics at home  4. Acute Bronchitis: IV rocephin.  5. CAD: continue aspirin 81 mg,  discontinuedPlavix 75 mgas patient is on eliquis  6. Pulmonary nodules repeat CAT scan of the chest in 6 months to 1 year    7. Elevated TSH-nml free T4   T3 in a.m.  Transfer patient to do her home Hospital when bed is available. Discuss[facility] with Dr. Si GaulHu [facility] once he is off of Cardizem drip      All the records are reviewed and case discussed with Care Management/Social Workerr. Management plans discussed with the patient, family and they are in agreement.  CODE STATUS: DNR  TOTAL CRITICAL CARE TIME TAKING CARE OF THIS PATIENT: 38minutes.   POSSIBLE D/C IN 2-3 DAYS, DEPENDING ON CLINICAL CONDITION.  Note: This dictation was prepared with Dragon dictation along with smaller phrase technology. Any transcriptional errors that result from this process are unintentional.   Ramonita LabGouru, Donie Moulton M.D on 05/22/2016 at 4:33 PM  Between 7am to 6pm - Pager - [phone] After 6pm go to [email or web address] - password [email or web address] [facility]  [facility] [place]  Office  [phone]  CC: Primary care physician; No primary care provider on file.

## 2016-05-22 NOTE — Care Management Important Message (Signed)
Important Message  Patient Details  Name: Thomas Watkins MRN: [id number] Date of Birth: 08/30/1946   Medicare Important Message Given:  Yes    Olegario MessierKathy A Enjoli Tidd 05/22/2016, 9:41 AM

## 2016-05-22 NOTE — Care Management (Signed)
[facility] could not be [facility] at facility on telemetry unit because is on cardizem drip and could not be accepted in icu because the cardizem drip is being weaned.

## 2016-05-23 LAB — T3: T3, Total: 38 ng/dL — ABNORMAL LOW (ref 71–180)

## 2016-05-23 LAB — GLUCOSE, CAPILLARY
GLUCOSE-CAPILLARY: 148 mg/dL — AB (ref 65–99)
GLUCOSE-CAPILLARY: 162 mg/dL — AB (ref 65–99)
Glucose-Capillary: 181 mg/dL — ABNORMAL HIGH (ref 65–99)

## 2016-05-23 MED ORDER — BISACODYL 10 MG RE SUPP
10.0000 mg | Freq: Once | RECTAL | Status: AC
  Administered 2016-05-23: 10 mg via RECTAL
  Filled 2016-05-23: qty 1

## 2016-05-23 MED ORDER — VENLAFAXINE HCL ER 75 MG PO CP24: 75.0000 mg | ORAL_CAPSULE | Freq: Every day | ORAL | Status: DC

## 2016-05-23 MED ORDER — ALBUTEROL SULFATE HFA 108 (90 BASE) MCG/ACT IN AERS: 2.0000 | INHALATION_SPRAY | Freq: Four times a day (QID) | RESPIRATORY_TRACT | Status: DC | PRN

## 2016-05-23 MED ORDER — RISPERIDONE 0.25 MG PO TABS: 0.2500 mg | ORAL_TABLET | Freq: Every day | ORAL | Status: DC

## 2016-05-23 MED ORDER — SENNA 8.6 MG PO TABS: 1.0000 | ORAL_TABLET | Freq: Two times a day (BID) | ORAL | Status: DC

## 2016-05-23 MED ORDER — DILTIAZEM HCL ER COATED BEADS 120 MG PO CP24: 120.0000 mg | ORAL_CAPSULE | Freq: Every day | ORAL | Status: DC

## 2016-05-23 MED ORDER — OXYCODONE HCL 5 MG PO TABS: 5.0000 mg | ORAL_TABLET | Freq: Two times a day (BID) | ORAL | Status: DC | PRN

## 2016-05-23 MED ORDER — LISINOPRIL 20 MG PO TABS: 20.0000 mg | ORAL_TABLET | Freq: Every day | ORAL | Status: DC

## 2016-05-23 MED ORDER — GUAIFENESIN-DM 100-10 MG/5ML PO SYRP: 5.0000 mL | ORAL_SOLUTION | ORAL | Status: DC | PRN

## 2016-05-23 MED ORDER — METOPROLOL SUCCINATE ER 25 MG PO TB24: 50.0000 mg | ORAL_TABLET | Freq: Every day | ORAL | Status: DC

## 2016-05-23 MED ORDER — FUROSEMIDE 20 MG PO TABS: 20.0000 mg | ORAL_TABLET | Freq: Two times a day (BID) | ORAL | Status: DC

## 2016-05-23 MED ORDER — METFORMIN HCL ER 750 MG PO TB24: 1500.0000 mg | ORAL_TABLET | Freq: Every day | ORAL | Status: DC

## 2016-05-23 MED ORDER — APIXABAN 5 MG PO TABS: 5.0000 mg | ORAL_TABLET | Freq: Two times a day (BID) | ORAL | Status: DC

## 2016-05-23 MED ORDER — AMOXICILLIN-POT CLAVULANATE 875-125 MG PO TABS: 1.0000 | ORAL_TABLET | Freq: Two times a day (BID) | ORAL | Status: DC

## 2016-05-23 MED ORDER — MEMANTINE HCL 5 MG PO TABS: 5.0000 mg | ORAL_TABLET | Freq: Two times a day (BID) | ORAL | Status: AC

## 2016-05-23 NOTE — Discharge Instructions (Signed)
Activity as tolerated Diet cardiac Follow-up with primary care physician at the facility in 3 days Follow-up with primary cardiologist Dr. Gerilyn PilgrimJacob at [place] in a week Outpatient follow-up with CHF clinic Daily weight monitoring and intake and output monitoring

## 2016-05-23 NOTE — Progress Notes (Signed)
SATURATION QUALIFICATIONS: (This note is used to comply with regulatory documentation for home oxygen)  Patient Saturations on Room Air at Rest = 98%  Patient Saturations on Room Air while Ambulating =95%  Patient Saturations on 2 Liters of oxygen while Ambulating = 98%  Please briefly explain why patient needs home oxygen:

## 2016-05-23 NOTE — Care Management (Signed)
Discussed during progression of need to reassess for the need of home 02 at discharge.

## 2016-05-23 NOTE — Care Management (Signed)
Patient does not qualify for home 02.  Will discharge back to memory care today with home health nursing and physical therapy.  (DC was postponed 05/22/2016)

## 2016-05-23 NOTE — Progress Notes (Addendum)
Clinical Social Worker was informed that patient will be medically ready to [facility]. Patient and his wife are in a agreement with plan. CSW called Tiffany- Admissions RN at [facility] to confirm that patient's bed is ready. [facility]. DNR and RXs added to discharge packet.[facility].  Woodroe Modehristina Shirlean Berman, MSW, LCSW-A, LCAS-A Clinical Social Worker [phone]

## 2016-05-23 NOTE — Progress Notes (Signed)
A fib. Room air. BM today. Takes meds ok. Wife at the bedside. FS are stable. Tolerating diet ok. Pt reported pain and received oxy. Pt has no further concerns at this time.

## 2016-05-23 NOTE — Progress Notes (Signed)
IV and tele removed. Discharge instructions in packet. Confused. Wife to take pt to facility. Pt has no further concerns at this time.

## 2016-05-23 NOTE — Discharge Summary (Signed)
[facility] - [place] [facility]   PATIENT NAME: Thomas Watkins    MR#:  [id number]  DATE OF BIRTH:  1946/04/24  DATE OF ADMISSION:  05/20/2016 ADMITTING PHYSICIAN: Gracelyn Nurse, MD  DATE OF DISCHARGE: 05/23/16 PRIMARY CARE PHYSICIAN: No primary care provider on file.    ADMISSION DIAGNOSIS:  Atrial flutter with rapid ventricular response (HCC) [I48.92]  DISCHARGE DIAGNOSIS:  Active Problems:   Atrial fibrillation with RVR (HCC) acute bronchitis Diastolic CHF   SECONDARY DIAGNOSIS:   Past Medical History  Diagnosis Date  . CAD (coronary artery disease)     a. CABG 2000; b. multiple stents; c. cardiac cath 02/05/2015: occluded mLAD, patent mid to distal LAD after the 100% occlusion with free LIMA to the mid LAD,  patent mid LAD stent w/ mild ISR, patent proximal to mid LAD stent before 100% occlusion, LCx & RCA with mild diffuse disease. EF >55%. No intervention needed. Medical management. CP likely not cardiac.   . Diastolic dysfunction     a. echo 01/2015: EF 60-65%, diastolic dysfunction, mild LVH, mildly dilated LA, mild AS (possibly calcified), mild TR,   . HTN (hypertension)   . OSA (obstructive sleep apnea)   . Dementia   . Diabetes mellitus without complication (HCC)   . CHF (congestive heart failure) (HCC)     HOSPITAL COURSE:   1.Acute Respiratory Failure: Likely combination of CHF brought on by a-fib with RVR and bronchitis.  Wean off o2  . on full dose anticoagulation. CT angiogram of the chest is negative for pulmonary embolism cxr nml  2. Atrial Fib./ Atrial Flutter with RVR: New onset- GIVEN  cardizem drip to control heart rate, wean to oral medication Cardizem by mouth 30 mg by mouth every 6 hours Anticoagulate with Eliquis,Discontinued Plavix Echo 55-60 % Appreciate [facility] cardiology recs.  He is followed by cardiologist in [place], Dr Gerilyn Pilgrim.  3. CHF: Has history of dyastolic CHF. CXR not very impressive and BNP not  elevated has received 2 doses of IV Lasix,Continue by mouth diuretics. Outpatient cardiac follow-up Monitor daily weights and intake and output Not on diuretics at home  4. Acute Bronchitis: IV rocephin changed to by mouth ciprofloxacin. In  5. CAD: continue aspirin 81 mg, discontinuedPlavix 75 mgas patient is on eliquis  6. Pulmonary nodules pcp to  repeat CAT scan of the chest in 6 months to 1 year   7. Elevated TSH- nml free T4  T3 is nml  PCP to consider repeating thyroid panel in 6 weeks to 8 weeks  DISCHARGE CONDITIONS:   fair  CONSULTS OBTAINED:  Treatment Team:  Ramonita Lab, MD Alwyn Pea, MD Marcina Millard, MD   PROCEDURES none   DRUG ALLERGIES:   Allergies  Allergen Reactions  . Atorvastatin Other (See Comments)    Joint swelling  . Duloxetine Hcl Rash  . Carbamazepine   . Lecithin   . Oxcarbazepine   . Tizanidine Hcl   . Yellow Dyes (Non-Tartrazine)     Yellow dye #5 specifically    DISCHARGE MEDICATIONS:   Current Discharge Medication List    START taking these medications   Details  albuterol (PROVENTIL HFA;VENTOLIN HFA) 108 (90 Base) MCG/ACT inhaler Inhale 2 puffs into the lungs every 6 (six) hours as needed for wheezing or shortness of breath. Qty: 1 Inhaler, Refills: 2    amoxicillin-clavulanate (AUGMENTIN) 875-125 MG tablet Take 1 tablet by mouth 2 (two) times daily. Qty: 10 tablet, Refills: 0  apixaban (ELIQUIS) 5 MG TABS tablet Take 1 tablet (5 mg total) by mouth 2 (two) times daily. Qty: 60 tablet, Refills: 0    diltiazem (CARDIZEM CD) 120 MG 24 hr capsule Take 1 capsule (120 mg total) by mouth daily. Qty: 30 capsule, Refills: 0    furosemide (LASIX) 20 MG tablet Take 1 tablet (20 mg total) by mouth 2 (two) times daily. Qty: 30 tablet, Refills: 0    guaiFENesin-dextromethorphan (ROBITUSSIN DM) 100-10 MG/5ML syrup Take 5 mLs by mouth every 4 (four) hours as needed for cough. Qty: 118 mL, Refills: 0    memantine  (NAMENDA) 5 MG tablet Take 1 tablet (5 mg total) by mouth 2 (two) times daily. Qty: 60 tablet, Refills: 0    metFORMIN (GLUCOPHAGE-XR) 750 MG 24 hr tablet Take 2 tablets (1,500 mg total) by mouth daily with breakfast. Qty: 60 tablet, Refills: 0    oxyCODONE (OXY IR/ROXICODONE) 5 MG immediate release tablet Take 1 tablet (5 mg total) by mouth 2 (two) times daily as needed for severe pain. Qty: 30 tablet, Refills: 0    risperiDONE (RISPERDAL) 0.25 MG tablet Take 1 tablet (0.25 mg total) by mouth at bedtime. Qty: 30 tablet, Refills: 0    senna (SENOKOT) 8.6 MG TABS tablet Take 1 tablet (8.6 mg total) by mouth 2 (two) times daily. Qty: 120 each, Refills: 0    venlafaxine XR (EFFEXOR-XR) 75 MG 24 hr capsule Take 1 capsule (75 mg total) by mouth daily with breakfast. Qty: 30 capsule, Refills: 0      CONTINUE these medications which have CHANGED   Details  lisinopril (PRINIVIL,ZESTRIL) 20 MG tablet Take 1 tablet (20 mg total) by mouth daily. Qty: 30 tablet, Refills: 0    metoprolol succinate (TOPROL-XL) 25 MG 24 hr tablet Take 2 tablets (50 mg total) by mouth daily. Qty: 30 tablet, Refills: 0      CONTINUE these medications which have NOT CHANGED   Details  aspirin EC 81 MG tablet Take 81 mg by mouth daily.    donepezil (ARICEPT) 5 MG tablet Take 5 mg by mouth daily.    hydrOXYzine (ATARAX/VISTARIL) 25 MG tablet Take 25 mg by mouth 3 (three) times daily as needed for anxiety.    isosorbide mononitrate (IMDUR) 120 MG 24 hr tablet Take 120 mg by mouth daily.    Melatonin 3 MG TABS Take 3 mg by mouth at bedtime as needed (for sleep).    prazosin (MINIPRESS) 2 MG capsule Take 2 mg by mouth daily.    rosuvastatin (CRESTOR) 10 MG tablet Take 10 mg by mouth daily.      STOP taking these medications     clopidogrel (PLAVIX) 75 MG tablet          DISCHARGE INSTRUCTIONS:  Activity as tolerated Diet cardiac Follow-up with primary care physician at the facility in 3  days Follow-up with primary cardiologist Dr. Gerilyn Pilgrim at [place] in a week Outpatient follow-up with CHF clinic Daily weight monitoring and intake and output monitoring    DIET:  Cardiac diet  DISCHARGE CONDITION:  Fair  ACTIVITY:  Activity as tolerated  OXYGEN:  Home Oxygen: No.   Oxygen Delivery: room air  DISCHARGE LOCATION:  Assisted living   If you experience worsening of your admission symptoms, develop shortness of breath, life threatening emergency, suicidal or homicidal thoughts you must seek medical attention immediately by calling 911 or calling your MD immediately  if symptoms less severe.  You Must read complete instructions/literature along with  all the possible adverse reactions/side effects for all the Medicines you take and that have been prescribed to you. Take any new Medicines after you have completely understood and accpet all the possible adverse reactions/side effects.   Please note  You were cared for by a hospitalist during your hospital stay. If you have any questions about your discharge medications or the care you received while you were in the hospital after you are discharged, you can call the unit and asked to speak with the hospitalist on call if the hospitalist that took care of you is not available. Once you are discharged, your primary care physician will handle any further medical issues. Please note that NO REFILLS for any discharge medications will be authorized once you are discharged, as it is imperative that you return to your primary care physician (or establish a relationship with a primary care physician if you do not have one) for your aftercare needs so that they can reassess your need for medications and monitor your lab values.     Today  Chief Complaint  Patient presents with  . Respiratory Distress   Pt is resting comfortably. Denies any chest pain or shortness of breath feels comfortable to go home  ROS:  CONSTITUTIONAL:  Denies fevers, chills. Denies any fatigue, weakness.  EYES: Denies blurry vision, double vision, eye pain. EARS, NOSE, THROAT: Denies tinnitus, ear pain, hearing loss. RESPIRATORY: Denies cough, wheeze, shortness of breath.  CARDIOVASCULAR: Denies chest pain, palpitations, edema.  GASTROINTESTINAL: Denies nausea, vomiting, diarrhea, abdominal pain. Denies bright red blood per rectum. GENITOURINARY: Denies dysuria, hematuria. ENDOCRINE: Denies nocturia or thyroid problems. HEMATOLOGIC AND LYMPHATIC: Denies easy bruising or bleeding. SKIN: Denies rash or lesion. MUSCULOSKELETAL: Denies pain in neck, back, shoulder, knees, hips or arthritic symptoms.  NEUROLOGIC: Denies paralysis, paresthesias.  PSYCHIATRIC: Denies anxiety or depressive symptoms.   VITAL SIGNS:  Blood pressure 133/74, pulse 63, temperature 98.2 F (36.8 C), temperature source Oral, resp. rate 20, height 6' (1.829 m), weight 141.613 kg (312 lb 3.2 oz), SpO2 94 %.  I/O:    Intake/Output Summary (Last 24 hours) at 05/23/16 1450 Last data filed at 05/23/16 1300  Gross per 24 hour  Intake    720 ml  Output   1751 ml  Net  -1031 ml    PHYSICAL EXAMINATION:  GENERAL:  70 y.o.-year-old patient lying in the bed with no acute distress.  EYES: Pupils equal, round, reactive to light and accommodation. No scleral icterus. Extraocular muscles intact.  HEENT: Head atraumatic, normocephalic. Oropharynx and nasopharynx clear.  NECK:  Supple, no jugular venous distention. No thyroid enlargement, no tenderness.  LUNGS: Normal breath sounds bilaterally, no wheezing, rales,rhonchi or crepitation. No use of accessory muscles of respiration.  CARDIOVASCULAR: S1, S2 normal. No murmurs, rubs, or gallops.  ABDOMEN: Soft, non-tender, non-distended. Bowel sounds present. No organomegaly or mass.  EXTREMITIES: No pedal edema, cyanosis, or clubbing.  NEUROLOGIC: Cranial nerves II through XII are intact. Muscle strength 5/5 in all extremities.  Sensation intact. Gait not checked.  PSYCHIATRIC: The patient is alert and oriented x 3.  SKIN: No obvious rash, lesion, or ulcer.   DATA REVIEW:   CBC  Recent Labs Lab 05/21/16 0404  WBC 10.5  HGB 14.5  HCT 43.3  PLT 156    Chemistries   Recent Labs Lab 05/20/16 0250  05/22/16 0457  NA 137  < > 137  K 4.1  < > 4.3  CL 103  < > 99*  CO2 27  < >  31  GLUCOSE 154*  < > 153*  BUN 12  < > 15  CREATININE 0.76  < > 0.86  CALCIUM 8.5*  < > 8.7*  AST 38  --   --   ALT 40  --   --   ALKPHOS 59  --   --   BILITOT 0.6  --   --   < > = values in this interval not displayed.  Cardiac Enzymes  Recent Labs Lab 05/20/16 2023  TROPONINI <0.03    Microbiology Results  Results for orders placed or performed during the hospital encounter of 05/20/16  MRSA PCR Screening     Status: None   Collection Time: 05/20/16  7:56 AM  Result Value Ref Range Status   MRSA by PCR NEGATIVE NEGATIVE Final    Comment:        The GeneXpert MRSA Assay (FDA approved for NASAL specimens only), is one component of a comprehensive MRSA colonization surveillance program. It is not intended to diagnose MRSA infection nor to guide or monitor treatment for MRSA infections.     RADIOLOGY:  Dg Chest 2 View  05/22/2016  CLINICAL DATA:  Productive cough, congestion, shortness of Breath EXAM: CHEST  2 VIEW COMPARISON:  05/20/2016 FINDINGS: Cardiomediastinal silhouette is unremarkable. No acute infiltrate or pleural effusion. No pulmonary edema. Bony thorax is unremarkable. Mild infrahilar bronchitic changes. There is a spinal wire stimulator in thoracic spine. Status post median sternotomy. IMPRESSION: No infiltrate or pulmonary edema. Mild infrahilar bronchitic changes. Electronically Signed   By: Natasha MeadLiviu  Pop M.D.   On: 05/22/2016 14:11   Ct Angio Chest Pe W Or Wo Contrast  05/21/2016  CLINICAL DATA:  70 year old male with severe diaphoresis and flushing. Wheezing and crackling bilaterally in the  lungs. EXAM: CT ANGIOGRAPHY CHEST WITH CONTRAST TECHNIQUE: Multidetector CT imaging of the chest was performed using the standard protocol during bolus administration of intravenous contrast. Multiplanar CT image reconstructions and MIPs were obtained to evaluate the vascular anatomy. CONTRAST:  100 mL of Isovue 370. COMPARISON:  No priors. FINDINGS: Mediastinum/Lymph Nodes: There are no filling defects within the pulmonary arterial tree to suggest underlying pulmonary embolism. Heart size is normal. There is no significant pericardial fluid, thickening or pericardial calcification. There is aortic atherosclerosis, as well as atherosclerosis of the great vessels of the mediastinum and the coronary arteries, including calcified atherosclerotic plaque in the left main, left anterior descending, left circumflex and right coronary arteries. Status post median sternotomy for CABG. Severe calcifications of the aortic valve, particularly the non coronary cusp. Multiple prominent borderline enlarged mediastinal and bilateral hilar lymph nodes are nonspecific. Esophagus is unremarkable in appearance. No axillary lymphadenopathy. Lungs/Pleura: Tiny pulmonary nodules in the right middle lobe and right lower lobe, measuring up to 4 mm in the subpleural aspect of the right lower lobe (image 92 of series 6) are highly nonspecific. No other larger more suspicious appearing pulmonary nodules or masses are noted. No acute consolidative airspace disease. No pleural effusions. Upper Abdomen: Diffuse low attenuation throughout the hepatic parenchyma, compatible with severe hepatic steatosis. Musculoskeletal/Soft Tissues: Median sternotomy wires. Spinal cord stimulator in place overlying the mid to lower thoracic spine. There are no aggressive appearing lytic or blastic lesions noted in the visualized portions of the skeleton. Review of the MIP images confirms the above findings. IMPRESSION: 1. No evidence of pulmonary embolism. 2. No  acute findings in the thorax to account for the patient's symptoms. 3. Aortic atherosclerosis, in addition to left main  and 3 vessel coronary artery disease. Status post median sternotomy for CABG. 4. There are calcifications of the aortic valve. Echocardiographic correlation for evaluation of potential valvular dysfunction may be warranted if clinically indicated. 5. Severe hepatic steatosis. 6. Tiny pulmonary nodules in the right lung measuring 4 mm or less in size. These are nonspecific, but statistically likely benign. No follow-up needed if patient is low-risk (and has no known or suspected primary neoplasm). Non-contrast chest CT can be considered in 12 months if patient is high-risk. This recommendation follows the consensus statement: Guidelines for Management of Incidental Pulmonary Nodules Detected on CT Images:From the Fleischner Society 2017; published online before print (10.1148/[id number]). Electronically Signed   By: Trudie Reedaniel  Entrikin M.D.   On: 05/21/2016 11:15   Dg Chest Port 1 View  05/20/2016  CLINICAL DATA:  70 year old male with shortness of breath. EXAM: PORTABLE CHEST 1 VIEW COMPARISON:  Chest radiograph dated 02/06/2015 FINDINGS: Single portable view of the chest demonstrates left lung base atelectatic changes versus less likely infiltrate. The right lung is clear. There is no pleural effusion or pneumothorax. Stable cardiac silhouette. Median sternotomy wires. No acute osseous pathology. IMPRESSION: Left lung base atelectasis versus less likely infiltrate. Electronically Signed   By: Elgie CollardArash  Radparvar M.D.   On: 05/20/2016 03:09    EKG:   Orders placed or performed in visit on 05/20/16  . EKG 12-Lead  . EKG 12-Lead  . EKG 12-Lead      Management plans discussed with the patient, family and they are in agreement.  CODE STATUS:     Code Status Orders        Start     Ordered   05/20/16 0817  Do not attempt resuscitation (DNR)   Continuous    Question Answer  Comment  In the event of cardiac or respiratory ARREST Do not call a "code blue"   In the event of cardiac or respiratory ARREST Do not perform Intubation, CPR, defibrillation or ACLS   In the event of cardiac or respiratory ARREST Use medication by any route, position, wound care, and other measures to relive pain and suffering. May use oxygen, suction and manual treatment of airway obstruction as needed for comfort.      05/20/16 0817    Code Status History    Date Active Date Inactive Code Status Order ID Comments User Context   This patient has a current code status but no historical code status.    Advance Directive Documentation        Most Recent Value   Type of Advance Directive  Out of facility DNR (pink MOST or yellow form)   Pre-existing out of facility DNR order (yellow form or pink MOST form)  Physician notified to receive inpatient order   "MOST" Form in Place?        TOTAL TIME TAKING CARE OF THIS PATIENT: 45  minutes.   Note: This dictation was prepared with Dragon dictation along with smaller phrase technology. Any transcriptional errors that result from this process are unintentional.   @[facility] @  on 05/23/2016 at 2:50 PM  Between 7am to 6pm - Pager - [phone]  After 6pm go to [email or web address] - password [email or web address] [facility]  [facility]  Office  [phone]  CC: Primary care physician; No primary care provider on file.

## 2017-06-19 ENCOUNTER — Ambulatory Visit: Payer: Self-pay | Admitting: Podiatry

## 2017-07-03 ENCOUNTER — Ambulatory Visit: Payer: Self-pay | Admitting: Podiatry

## 2017-08-04 ENCOUNTER — Ambulatory Visit: Payer: Self-pay | Admitting: Podiatry

## 2017-12-18 ENCOUNTER — Ambulatory Visit (INDEPENDENT_AMBULATORY_CARE_PROVIDER_SITE_OTHER): Payer: Non-veteran care | Admitting: Podiatry

## 2017-12-18 DIAGNOSIS — M79676 Pain in unspecified toe(s): Secondary | ICD-10-CM

## 2017-12-18 DIAGNOSIS — B351 Tinea unguium: Secondary | ICD-10-CM

## 2017-12-18 DIAGNOSIS — E0843 Diabetes mellitus due to underlying condition with diabetic autonomic (poly)neuropathy: Secondary | ICD-10-CM | POA: Diagnosis not present

## 2017-12-18 NOTE — Progress Notes (Signed)
   SUBJECTIVE Patient with a history of diabetes mellitus presents to office today complaining of elongated, thickened nails. Pain while ambulating in shoes. Patient is unable to trim their own nails.   Past Medical History:  Diagnosis Date  . CAD (coronary artery disease)    a. CABG 2000; b. multiple stents; c. cardiac cath 02/05/2015: occluded mLAD, patent mid to distal LAD after the 100% occlusion with free LIMA to the mid LAD,  patent mid LAD stent w/ mild ISR, patent proximal to mid LAD stent before 100% occlusion, LCx & RCA with mild diffuse disease. EF >55%. No intervention needed. Medical management. CP likely not cardiac.   Marland Kitchen. CHF (congestive heart failure) (HCC)   . Dementia   . Diabetes mellitus without complication (HCC)   . Diastolic dysfunction    a. echo 01/2015: EF 60-65%, diastolic dysfunction, mild LVH, mildly dilated LA, mild AS (possibly calcified), mild TR,   . HTN (hypertension)   . OSA (obstructive sleep apnea)     OBJECTIVE General Patient is awake, alert, and oriented x 3 and in no acute distress. Derm Skin is dry and supple bilateral. Negative open lesions or macerations. Remaining integument unremarkable. Nails are tender, long, thickened and dystrophic with subungual debris, consistent with onychomycosis, 1-5 bilateral. No signs of infection noted. Vasc  DP and PT pedal pulses palpable bilaterally. Temperature gradient within normal limits.  Neuro Epicritic and protective threshold sensation diminished bilaterally.  Musculoskeletal Exam No symptomatic pedal deformities noted bilateral. Muscular strength within normal limits.  ASSESSMENT 1. Diabetes Mellitus w/ peripheral neuropathy 2. Onychomycosis of nail due to dermatophyte bilateral 3. Pain in foot bilateral  PLAN OF CARE 1. Patient evaluated today. 2. Instructed to maintain good pedal hygiene and foot care. Stressed importance of controlling blood sugar.  3. Mechanical debridement of nails 1-5 bilaterally  performed using a nail nipper. Filed with dremel without incident.  4. Return to clinic in 3 mos.     Felecia ShellingBrent M. Evans, DPM [facility]  Dr. Felecia ShellingBrent M. Evans, DPM    [address]                                        [place], [place] [address]                Office [phone]  Fax [phone]

## 2018-12-16 ENCOUNTER — Encounter: Payer: Self-pay | Admitting: Podiatry

## 2018-12-16 ENCOUNTER — Ambulatory Visit (INDEPENDENT_AMBULATORY_CARE_PROVIDER_SITE_OTHER): Payer: No Typology Code available for payment source | Admitting: Podiatry

## 2018-12-16 DIAGNOSIS — B351 Tinea unguium: Secondary | ICD-10-CM | POA: Diagnosis not present

## 2018-12-16 DIAGNOSIS — M79676 Pain in unspecified toe(s): Secondary | ICD-10-CM

## 2018-12-16 DIAGNOSIS — E0843 Diabetes mellitus due to underlying condition with diabetic autonomic (poly)neuropathy: Secondary | ICD-10-CM | POA: Diagnosis not present

## 2018-12-16 NOTE — Progress Notes (Signed)
Complaint:  Visit Type: Patient returns to my office for continued preventative foot care services. Complaint: Patient states" my nails have grown long and thick and become painful to walk and wear shoes" Patient has been diagnosed with DM with no foot complications. The patient presents for preventative foot care services. No changes to ROS  Podiatric Exam: Vascular: dorsalis pedis and posterior tibial pulses are palpable bilateral. Capillary return is immediate. Temperature gradient is WNL. Skin turgor WNL  Sensorium: Normal Semmes Weinstein monofilament test. Normal tactile sensation bilaterally. Nail Exam: Pt has thick disfigured discolored nails with subungual debris noted bilateral entire nail hallux through fifth toenails Ulcer Exam: There is no evidence of ulcer or pre-ulcerative changes or infection. Orthopedic Exam: Muscle tone and strength are WNL. No limitations in general ROM. No crepitus or effusions noted. Foot type and digits show no abnormalities. Bony prominences are unremarkable. Skin: No Porokeratosis. No infection or ulcers  Diagnosis:  Onychomycosis, , Pain in right toe, pain in left toes  Treatment & Plan Procedures and Treatment: Consent by patient was obtained for treatment procedures.   Debridement of mycotic and hypertrophic toenails, 1 through 5 bilateral and clearing of subungual debris. No ulceration, no infection noted.  Return Visit-Office Procedure: Patient instructed to return to the office for a follow up visit 3 months for continued evaluation and treatment.    Katiria Calame DPM 

## 2019-04-25 ENCOUNTER — Emergency Department: Payer: Medicare Other

## 2019-04-25 ENCOUNTER — Inpatient Hospital Stay
Admission: EM | Admit: 2019-04-25 | Discharge: 2019-04-30 | DRG: 871 | Disposition: A | Discharge status: Other Acute Inpatient Hospital | Payer: Medicare Other | Source: Skilled Nursing Facility | Attending: Internal Medicine | Admitting: Internal Medicine

## 2019-04-25 ENCOUNTER — Other Ambulatory Visit: Payer: Self-pay

## 2019-04-25 DIAGNOSIS — I4891 Unspecified atrial fibrillation: Secondary | ICD-10-CM | POA: Diagnosis present

## 2019-04-25 DIAGNOSIS — I2510 Atherosclerotic heart disease of native coronary artery without angina pectoris: Secondary | ICD-10-CM | POA: Diagnosis present

## 2019-04-25 DIAGNOSIS — K559 Vascular disorder of intestine, unspecified: Secondary | ICD-10-CM | POA: Diagnosis present

## 2019-04-25 DIAGNOSIS — N179 Acute kidney failure, unspecified: Secondary | ICD-10-CM | POA: Diagnosis present

## 2019-04-25 DIAGNOSIS — Z951 Presence of aortocoronary bypass graft: Secondary | ICD-10-CM

## 2019-04-25 DIAGNOSIS — F0390 Unspecified dementia without behavioral disturbance: Secondary | ICD-10-CM | POA: Diagnosis present

## 2019-04-25 DIAGNOSIS — A419 Sepsis, unspecified organism: Secondary | ICD-10-CM | POA: Diagnosis present

## 2019-04-25 DIAGNOSIS — E1140 Type 2 diabetes mellitus with diabetic neuropathy, unspecified: Secondary | ICD-10-CM | POA: Diagnosis present

## 2019-04-25 DIAGNOSIS — R197 Diarrhea, unspecified: Secondary | ICD-10-CM

## 2019-04-25 DIAGNOSIS — T502X5A Adverse effect of carbonic-anhydrase inhibitors, benzothiadiazides and other diuretics, initial encounter: Secondary | ICD-10-CM | POA: Diagnosis present

## 2019-04-25 DIAGNOSIS — D62 Acute posthemorrhagic anemia: Secondary | ICD-10-CM | POA: Diagnosis present

## 2019-04-25 DIAGNOSIS — I110 Hypertensive heart disease with heart failure: Secondary | ICD-10-CM | POA: Diagnosis present

## 2019-04-25 DIAGNOSIS — I959 Hypotension, unspecified: Secondary | ICD-10-CM

## 2019-04-25 DIAGNOSIS — E669 Obesity, unspecified: Secondary | ICD-10-CM | POA: Diagnosis present

## 2019-04-25 DIAGNOSIS — D509 Iron deficiency anemia, unspecified: Secondary | ICD-10-CM | POA: Diagnosis present

## 2019-04-25 DIAGNOSIS — Z87891 Personal history of nicotine dependence: Secondary | ICD-10-CM

## 2019-04-25 DIAGNOSIS — R6521 Severe sepsis with septic shock: Secondary | ICD-10-CM | POA: Diagnosis present

## 2019-04-25 DIAGNOSIS — E876 Hypokalemia: Secondary | ICD-10-CM | POA: Diagnosis not present

## 2019-04-25 DIAGNOSIS — R571 Hypovolemic shock: Secondary | ICD-10-CM | POA: Diagnosis present

## 2019-04-25 DIAGNOSIS — E785 Hyperlipidemia, unspecified: Secondary | ICD-10-CM | POA: Diagnosis present

## 2019-04-25 DIAGNOSIS — N390 Urinary tract infection, site not specified: Secondary | ICD-10-CM | POA: Diagnosis present

## 2019-04-25 DIAGNOSIS — I5032 Chronic diastolic (congestive) heart failure: Secondary | ICD-10-CM | POA: Diagnosis present

## 2019-04-25 DIAGNOSIS — Z66 Do not resuscitate: Secondary | ICD-10-CM | POA: Diagnosis present

## 2019-04-25 DIAGNOSIS — Z8249 Family history of ischemic heart disease and other diseases of the circulatory system: Secondary | ICD-10-CM

## 2019-04-25 DIAGNOSIS — K51511 Left sided colitis with rectal bleeding: Secondary | ICD-10-CM | POA: Diagnosis present

## 2019-04-25 DIAGNOSIS — Z6841 Body Mass Index (BMI) 40.0 and over, adult: Secondary | ICD-10-CM

## 2019-04-25 DIAGNOSIS — Z794 Long term (current) use of insulin: Secondary | ICD-10-CM

## 2019-04-25 DIAGNOSIS — E875 Hyperkalemia: Secondary | ICD-10-CM | POA: Diagnosis not present

## 2019-04-25 DIAGNOSIS — Z20828 Contact with and (suspected) exposure to other viral communicable diseases: Secondary | ICD-10-CM | POA: Diagnosis present

## 2019-04-25 DIAGNOSIS — G4733 Obstructive sleep apnea (adult) (pediatric): Secondary | ICD-10-CM | POA: Diagnosis present

## 2019-04-25 DIAGNOSIS — W19XXXA Unspecified fall, initial encounter: Secondary | ICD-10-CM

## 2019-04-25 DIAGNOSIS — Z7902 Long term (current) use of antithrombotics/antiplatelets: Secondary | ICD-10-CM

## 2019-04-25 DIAGNOSIS — Z7901 Long term (current) use of anticoagulants: Secondary | ICD-10-CM

## 2019-04-25 DIAGNOSIS — A4181 Sepsis due to Enterococcus: Secondary | ICD-10-CM | POA: Diagnosis present

## 2019-04-25 DIAGNOSIS — R109 Unspecified abdominal pain: Secondary | ICD-10-CM

## 2019-04-25 LAB — URINALYSIS, COMPLETE (UACMP) WITH MICROSCOPIC
Bacteria, UA: NONE SEEN
Bilirubin Urine: NEGATIVE
Glucose, UA: NEGATIVE mg/dL
Ketones, ur: NEGATIVE mg/dL
Leukocytes,Ua: NEGATIVE
Nitrite: NEGATIVE
Protein, ur: 100 mg/dL — AB
Specific Gravity, Urine: 1.024 (ref 1.005–1.030)
pH: 5 (ref 5.0–8.0)

## 2019-04-25 LAB — COMPREHENSIVE METABOLIC PANEL
ALT: 21 U/L (ref 0–44)
AST: 38 U/L (ref 15–41)
Albumin: 3.7 g/dL (ref 3.5–5.0)
Alkaline Phosphatase: 91 U/L (ref 38–126)
Anion gap: 18 — ABNORMAL HIGH (ref 5–15)
BUN: 18 mg/dL (ref 8–23)
CO2: 17 mmol/L — ABNORMAL LOW (ref 22–32)
Calcium: 8.9 mg/dL (ref 8.9–10.3)
Chloride: 103 mmol/L (ref 98–111)
Creatinine, Ser: 1.56 mg/dL — ABNORMAL HIGH (ref 0.61–1.24)
GFR calc Af Amer: 50 mL/min — ABNORMAL LOW (ref >60)
GFR calc non Af Amer: 43 mL/min — ABNORMAL LOW (ref >60)
Glucose, Bld: 238 mg/dL — ABNORMAL HIGH (ref 70–99)
Potassium: 4 mmol/L (ref 3.5–5.1)
Sodium: 138 mmol/L (ref 135–145)
Total Bilirubin: 0.6 mg/dL (ref 0.3–1.2)
Total Protein: 7.3 g/dL (ref 6.5–8.1)

## 2019-04-25 LAB — GASTROINTESTINAL PANEL BY PCR, STOOL (REPLACES STOOL CULTURE)

## 2019-04-25 LAB — CBC WITH DIFFERENTIAL/PLATELET
Abs Immature Granulocytes: 0.09 10*3/uL — ABNORMAL HIGH (ref 0.00–0.07)
Basophils Absolute: 0 10*3/uL (ref 0.0–0.1)
Basophils Relative: 0 %
Eosinophils Absolute: 0.1 10*3/uL (ref 0.0–0.5)
Eosinophils Relative: 1 %
HCT: 40.4 % (ref 39.0–52.0)
Hemoglobin: 11.2 g/dL — ABNORMAL LOW (ref 13.0–17.0)
Immature Granulocytes: 1 %
Lymphocytes Relative: 40 %
Lymphs Abs: 6.9 10*3/uL — ABNORMAL HIGH (ref 0.7–4.0)
MCH: 21.1 pg — ABNORMAL LOW (ref 26.0–34.0)
MCHC: 27.7 g/dL — ABNORMAL LOW (ref 30.0–36.0)
MCV: 76.2 fL — ABNORMAL LOW (ref 80.0–100.0)
Monocytes Absolute: 0.8 10*3/uL (ref 0.1–1.0)
Monocytes Relative: 5 %
Neutro Abs: 9.1 10*3/uL — ABNORMAL HIGH (ref 1.7–7.7)
Neutrophils Relative %: 53 %
Platelets: 408 10*3/uL — ABNORMAL HIGH (ref 150–400)
RBC: 5.3 MIL/uL (ref 4.22–5.81)
RDW: 19.3 % — ABNORMAL HIGH (ref 11.5–15.5)
WBC Morphology: ABNORMAL
WBC: 17 10*3/uL — ABNORMAL HIGH (ref 4.0–10.5)
nRBC: 0.1 % (ref 0.0–0.2)

## 2019-04-25 LAB — MRSA PCR SCREENING: MRSA by PCR: POSITIVE — AB

## 2019-04-25 LAB — OCCULT BLOOD X 1 CARD TO LAB, STOOL: Fecal Occult Bld: POSITIVE — AB

## 2019-04-25 LAB — LACTIC ACID, PLASMA
Lactic Acid, Venous: 10.2 mmol/L (ref 0.5–1.9)
Lactic Acid, Venous: 9.5 mmol/L (ref 0.5–1.9)

## 2019-04-25 LAB — C DIFFICILE QUICK SCREEN W PCR REFLEX
C Diff antigen: NEGATIVE
C Diff interpretation: NOT DETECTED
C Diff toxin: NEGATIVE

## 2019-04-25 LAB — SAMPLE TO BLOOD BANK

## 2019-04-25 LAB — SARS CORONAVIRUS 2 BY RT PCR (HOSPITAL ORDER, PERFORMED IN ~~LOC~~ HOSPITAL LAB): SARS Coronavirus 2: NEGATIVE

## 2019-04-25 LAB — PROCALCITONIN: Procalcitonin: 16.05 ng/mL

## 2019-04-25 LAB — GLUCOSE, CAPILLARY: Glucose-Capillary: 135 mg/dL — ABNORMAL HIGH (ref 70–99)

## 2019-04-25 LAB — LIPASE, BLOOD: Lipase: 34 U/L (ref 11–51)

## 2019-04-25 LAB — CK: Total CK: 233 U/L (ref 49–397)

## 2019-04-25 LAB — TROPONIN I: Troponin I: <0.03 ng/mL (ref <0.03)

## 2019-04-25 MED ORDER — DULOXETINE HCL 30 MG PO CPEP: 60.0000 mg | ORAL_CAPSULE | Freq: Every day | ORAL | Status: DC

## 2019-04-25 MED ORDER — SODIUM CHLORIDE 0.9 % IV BOLUS
1000.0000 mL | Freq: Once | INTRAVENOUS | Status: AC
  Administered 2019-04-25: 1000 mL via INTRAVENOUS

## 2019-04-25 MED ORDER — ROSUVASTATIN CALCIUM 10 MG PO TABS
20.0000 mg | ORAL_TABLET | Freq: Every day | ORAL | Status: DC
  Administered 2019-04-25: 21:00:00 20 mg via ORAL
  Filled 2019-04-25: qty 2

## 2019-04-25 MED ORDER — LACTATED RINGERS IV BOLUS
500.0000 mL | Freq: Once | INTRAVENOUS | Status: AC
  Administered 2019-04-25: 22:00:00 500 mL via INTRAVENOUS

## 2019-04-25 MED ORDER — LACTATED RINGERS IV SOLN
INTRAVENOUS | Status: DC
  Administered 2019-04-25: 21:00:00 via INTRAVENOUS

## 2019-04-25 MED ORDER — SODIUM CHLORIDE 0.9 % IV BOLUS
1000.0000 mL | Freq: Once | INTRAVENOUS | Status: AC
Start: 2019-04-25 — End: 2019-04-25
  Administered 2019-04-25: 1000 mL via INTRAVENOUS

## 2019-04-25 MED ORDER — METRONIDAZOLE IN NACL 5-0.79 MG/ML-% IV SOLN
500.0000 mg | Freq: Once | INTRAVENOUS | Status: AC
  Administered 2019-04-25: 17:00:00 500 mg via INTRAVENOUS
  Filled 2019-04-25: qty 100

## 2019-04-25 MED ORDER — PANTOPRAZOLE SODIUM 40 MG IV SOLR
40.0000 mg | Freq: Two times a day (BID) | INTRAVENOUS | Status: DC
  Administered 2019-04-25 – 2019-04-29 (×9): 40 mg via INTRAVENOUS
  Filled 2019-04-25 (×8): qty 40

## 2019-04-25 MED ORDER — ONDANSETRON HCL 4 MG PO TABS: 4.0000 mg | ORAL_TABLET | Freq: Four times a day (QID) | ORAL | Status: DC | PRN

## 2019-04-25 MED ORDER — CHLORHEXIDINE GLUCONATE CLOTH 2 % EX PADS
6.0000 | MEDICATED_PAD | Freq: Every day | CUTANEOUS | Status: DC
  Administered 2019-04-25 – 2019-04-29 (×4): 6 via TOPICAL

## 2019-04-25 MED ORDER — METRONIDAZOLE IN NACL 5-0.79 MG/ML-% IV SOLN: 500.0000 mg | Freq: Once | INTRAVENOUS | Status: DC

## 2019-04-25 MED ORDER — VANCOMYCIN HCL 10 G IV SOLR: 1500.0000 mg | Freq: Once | INTRAVENOUS | Status: DC

## 2019-04-25 MED ORDER — VANCOMYCIN HCL 10 G IV SOLR: 1500.0000 mg | INTRAVENOUS | Status: DC

## 2019-04-25 MED ORDER — INSULIN ASPART 100 UNIT/ML ~~LOC~~ SOLN: 0.0000 [IU] | Freq: Three times a day (TID) | SUBCUTANEOUS | Status: DC

## 2019-04-25 MED ORDER — ONDANSETRON HCL 4 MG/2ML IJ SOLN
4.0000 mg | Freq: Four times a day (QID) | INTRAMUSCULAR | Status: DC | PRN
  Administered 2019-04-25 – 2019-04-28 (×3): 4 mg via INTRAVENOUS
  Filled 2019-04-25 (×3): qty 2

## 2019-04-25 MED ORDER — LACTATED RINGERS IV BOLUS
1000.0000 mL | Freq: Once | INTRAVENOUS | Status: AC
  Administered 2019-04-25: 21:00:00 1000 mL via INTRAVENOUS

## 2019-04-25 MED ORDER — MUPIROCIN 2 % EX OINT
1.0000 "application " | TOPICAL_OINTMENT | Freq: Two times a day (BID) | CUTANEOUS | Status: DC
  Administered 2019-04-25 – 2019-04-29 (×9): 1 via NASAL
  Filled 2019-04-25: qty 22

## 2019-04-25 MED ORDER — SODIUM CHLORIDE 0.9 % IV SOLN: INTRAVENOUS | Status: DC

## 2019-04-25 MED ORDER — INSULIN ASPART 100 UNIT/ML ~~LOC~~ SOLN: 0.0000 [IU] | Freq: Every day | SUBCUTANEOUS | Status: DC

## 2019-04-25 MED ORDER — CIPROFLOXACIN IN D5W 400 MG/200ML IV SOLN
400.0000 mg | Freq: Two times a day (BID) | INTRAVENOUS | Status: DC
  Administered 2019-04-25 – 2019-04-28 (×6): 400 mg via INTRAVENOUS
  Filled 2019-04-25 (×8): qty 200

## 2019-04-25 MED ORDER — ACETAMINOPHEN 650 MG RE SUPP: 650.0000 mg | Freq: Four times a day (QID) | RECTAL | Status: DC | PRN

## 2019-04-25 MED ORDER — ACETAMINOPHEN 325 MG PO TABS: 650.0000 mg | ORAL_TABLET | Freq: Four times a day (QID) | ORAL | Status: DC | PRN

## 2019-04-25 MED ORDER — NOREPINEPHRINE 4 MG/250ML-% IV SOLN
0.0000 ug/min | INTRAVENOUS | Status: DC
  Administered 2019-04-25: 20:00:00 4 ug/min via INTRAVENOUS
  Administered 2019-04-26: 05:00:00 7 ug/min via INTRAVENOUS
  Filled 2019-04-25 (×2): qty 250

## 2019-04-25 MED ORDER — SODIUM CHLORIDE 0.9 % IV SOLN: 2.0000 g | Freq: Two times a day (BID) | INTRAVENOUS | Status: DC

## 2019-04-25 MED ORDER — MORPHINE SULFATE (PF) 2 MG/ML IV SOLN
1.0000 mg | INTRAVENOUS | Status: DC | PRN
  Administered 2019-04-26 – 2019-04-29 (×5): 2 mg via INTRAVENOUS
  Filled 2019-04-25 (×5): qty 1

## 2019-04-25 MED ORDER — VANCOMYCIN HCL 10 G IV SOLR
2500.0000 mg | Freq: Once | INTRAVENOUS | Status: AC
  Administered 2019-04-25: 19:00:00 2500 mg via INTRAVENOUS
  Filled 2019-04-25: qty 2500

## 2019-04-25 MED ORDER — RANOLAZINE ER 500 MG PO TB12
500.0000 mg | ORAL_TABLET | Freq: Two times a day (BID) | ORAL | Status: DC
  Administered 2019-04-25: 21:00:00 500 mg via ORAL
  Filled 2019-04-25 (×2): qty 1

## 2019-04-25 MED ORDER — METRONIDAZOLE IN NACL 5-0.79 MG/ML-% IV SOLN
500.0000 mg | Freq: Three times a day (TID) | INTRAVENOUS | Status: DC
  Administered 2019-04-25 – 2019-04-28 (×9): 500 mg via INTRAVENOUS
  Filled 2019-04-25 (×11): qty 100

## 2019-04-25 MED ORDER — VANCOMYCIN HCL IN DEXTROSE 1-5 GM/200ML-% IV SOLN: 1000.0000 mg | Freq: Once | INTRAVENOUS | Status: DC

## 2019-04-25 MED ORDER — SODIUM CHLORIDE 0.9 % IV SOLN
2.0000 g | Freq: Once | INTRAVENOUS | Status: AC
  Administered 2019-04-25: 17:00:00 2 g via INTRAVENOUS
  Filled 2019-04-25: qty 2

## 2019-04-25 MED ORDER — MEMANTINE HCL 5 MG PO TABS
5.0000 mg | ORAL_TABLET | Freq: Two times a day (BID) | ORAL | Status: DC
  Administered 2019-04-25: 21:00:00 5 mg via ORAL
  Filled 2019-04-25 (×2): qty 1

## 2019-04-25 NOTE — ED Triage Notes (Signed)
Pt arrived via [facility] from [place] r/t a fall, pt also hypotensive. Fall was unwitnessed, pt pale and diaphoretic.

## 2019-04-25 NOTE — ED Notes (Signed)
ED TO INPATIENT HANDOFF REPORT  ED Nurse Name and Phone #: Peggye Formee  S Name/Age/Gender Thomas LarocheHerman Gaither Watkins 73 y.o. male Room/Bed: ED25A/ED25A  Code Status   Code Status: Prior  Home/SNF/Other Nursing Home Patient oriented to: self and place Is this baseline? Yes   Triage Complete: Triage complete  Chief Complaint fall  Triage Note Pt arrived via [facility] from [place] r/t a fall, pt also hypotensive. Fall was unwitnessed, pt pale and diaphoretic.    Allergies Allergies  Allergen Reactions  . Atorvastatin Other (See Comments)    Joint swelling  . Duloxetine Hcl Rash  . Carbamazepine   . Lecithin   . Oxcarbazepine   . Tizanidine Hcl   . Yellow Dyes (Non-Tartrazine)     Yellow dye #5 specifically    Level of Care/Admitting Diagnosis ED Disposition    None      B Medical/Surgery History Past Medical History:  Diagnosis Date  . CAD (coronary artery disease)    a. CABG 2000; b. multiple stents; c. cardiac cath 02/05/2015: occluded mLAD, patent mid to distal LAD after the 100% occlusion with free LIMA to the mid LAD,  patent mid LAD stent w/ mild ISR, patent proximal to mid LAD stent before 100% occlusion, LCx & RCA with mild diffuse disease. EF >55%. No intervention needed. Medical management. CP likely not cardiac.   Marland Kitchen. CHF (congestive heart failure) (HCC)   . Dementia (HCC)   . Diabetes mellitus without complication (HCC)   . Diastolic dysfunction    a. echo 01/2015: EF 60-65%, diastolic dysfunction, mild LVH, mildly dilated LA, mild AS (possibly calcified), mild TR,   . HTN (hypertension)   . OSA (obstructive sleep apnea)    History reviewed. No pertinent surgical history.   A IV Location/Drains/Wounds Patient Lines/Drains/Airways Status   Active Line/Drains/Airways    Name:   Placement date:   Placement time:   Site:   Days:   Peripheral IV 05/20/16 Right Forearm   05/20/16    -    Forearm   1070   Peripheral IV Left Antecubital   -    -    Antecubital      Peripheral IV 04/25/19 Right Hand   04/25/19    1507    Hand   less than 1          Intake/Output Last 24 hours No intake or output data in the 24 hours ending 04/25/19 1737  Labs/Imaging Results for orders placed or performed during the hospital encounter of 04/25/19 (from the past 48 hour(s))  CBC with Differential     Status: Abnormal   Collection Time: 04/25/19  3:02 PM  Result Value Ref Range   WBC 17.0 (H) 4.0 - 10.5 K/uL   RBC 5.30 4.22 - 5.81 MIL/uL   Hemoglobin 11.2 (L) 13.0 - 17.0 g/dL   HCT 16.140.4 09.639.0 - 04.552.0 %   MCV 76.2 (L) 80.0 - 100.0 fL   MCH 21.1 (L) 26.0 - 34.0 pg   MCHC 27.7 (L) 30.0 - 36.0 g/dL   RDW 40.919.3 (H) 81.111.5 - 91.415.5 %   Platelets 408 (H) 150 - 400 K/uL   nRBC 0.1 0.0 - 0.2 %   Neutrophils Relative % 53 %   Neutro Abs 9.1 (H) 1.7 - 7.7 K/uL   Lymphocytes Relative 40 %   Lymphs Abs 6.9 (H) 0.7 - 4.0 K/uL   Monocytes Relative 5 %   Monocytes Absolute 0.8 0.1 - 1.0 K/uL   Eosinophils Relative 1 %  Eosinophils Absolute 0.1 0.0 - 0.5 K/uL   Basophils Relative 0 %   Basophils Absolute 0.0 0.0 - 0.1 K/uL   WBC Morphology Abnormal lymphocytes present    Immature Granulocytes 1 %   Abs Immature Granulocytes 0.09 (H) 0.00 - 0.07 K/uL    Comment: Performed at [facility], [address] [place], [place] [address]  Comprehensive metabolic panel     Status: Abnormal   Collection Time: 04/25/19  3:02 PM  Result Value Ref Range   Sodium 138 135 - 145 mmol/L   Potassium 4.0 3.5 - 5.1 mmol/L   Chloride 103 98 - 111 mmol/L   CO2 17 (L) 22 - 32 mmol/L   Glucose, Bld 238 (H) 70 - 99 mg/dL   BUN 18 8 - 23 mg/dL   Creatinine, Ser 1.91 (H) 0.61 - 1.24 mg/dL   Calcium 8.9 8.9 - 47.8 mg/dL   Total Protein 7.3 6.5 - 8.1 g/dL   Albumin 3.7 3.5 - 5.0 g/dL   AST 38 15 - 41 U/L   ALT 21 0 - 44 U/L   Alkaline Phosphatase 91 38 - 126 U/L   Total Bilirubin 0.6 0.3 - 1.2 mg/dL   GFR calc non Af Amer 43 (L) >60 mL/min   GFR calc Af Amer 50 (L) >60 mL/min    Anion gap 18 (H) 5 - 15    Comment: Performed at [facility], [address] [place], [place] [address]  Troponin I - ONCE - STAT     Status: None   Collection Time: 04/25/19  3:02 PM  Result Value Ref Range   Troponin I <0.03 <0.03 ng/mL    Comment: Performed at [facility], [address] [place], [place] [address]  Lipase, blood     Status: None   Collection Time: 04/25/19  3:02 PM  Result Value Ref Range   Lipase 34 11 - 51 U/L    Comment: Performed at [facility], [address] [place], [place] [address]  CK     Status: None   Collection Time: 04/25/19  3:02 PM  Result Value Ref Range   Total CK 233 49 - 397 U/L    Comment: Performed at [facility], [address], [place], [place] [address]  Sample to Blood Bank     Status: None   Collection Time: 04/25/19  3:02 PM  Result Value Ref Range   Blood Bank Specimen SAMPLE AVAILABLE FOR TESTING    Sample Expiration      04/28/2019,2359 Performed at [facility], [address], [place], [place] [address]   SARS Coronavirus 2 (CEPHEID- Performed in [facility] hospital lab), Hosp Order     Status: None   Collection Time: 04/25/19  3:09 PM  Result Value Ref Range   SARS Coronavirus 2 NEGATIVE NEGATIVE    Comment: (NOTE) If result is NEGATIVE SARS-CoV-2 target nucleic acids are NOT DETECTED. The SARS-CoV-2 RNA is generally detectable in upper and lower  respiratory specimens during the acute phase of infection. The lowest  concentration of SARS-CoV-2 viral copies this assay can detect is 250  copies / mL. A negative result does not preclude SARS-CoV-2 infection  and should not be used as the sole basis for treatment or other  patient management decisions.  A negative result may occur with  improper specimen collection / handling, submission of specimen other  than nasopharyngeal swab, presence of viral mutation(s) within the  areas targeted by  this assay, and inadequate  number of viral copies  (<250 copies / mL). A negative result must be combined with clinical  observations, patient history, and epidemiological information. If result is POSITIVE SARS-CoV-2 target nucleic acids are DETECTED. The SARS-CoV-2 RNA is generally detectable in upper and lower  respiratory specimens dur ing the acute phase of infection.  Positive  results are indicative of active infection with SARS-CoV-2.  Clinical  correlation with patient history and other diagnostic information is  necessary to determine patient infection status.  Positive results do  not rule out bacterial infection or co-infection with other viruses. If result is PRESUMPTIVE POSTIVE SARS-CoV-2 nucleic acids MAY BE PRESENT.   A presumptive positive result was obtained on the submitted specimen  and confirmed on repeat testing.  While 2019 novel coronavirus  (SARS-CoV-2) nucleic acids may be present in the submitted sample  additional confirmatory testing may be necessary for epidemiological  and / or clinical management purposes  to differentiate between  SARS-CoV-2 and other Sarbecovirus currently known to infect humans.  If clinically indicated additional testing with an alternate test  methodology [id number]) is advised. The SARS-CoV-2 RNA is generally  detectable in upper and lower respiratory sp ecimens during the acute  phase of infection. The expected result is Negative. Fact Sheet for Patients:  BoilerBrush.com.cy Fact Sheet for Healthcare Providers: https://pope.com/ This test is not yet approved or cleared by the [place] FDA and has been authorized for detection and/or diagnosis of SARS-CoV-2 by FDA under an Emergency Use Authorization (EUA).  This EUA will remain in effect (meaning this test can be used) for the duration of the COVID-19 declaration under Section 564(b)(1) of the Act, 21 U.S.C. section 360bbb-3(b)(1), unless the  authorization is terminated or revoked sooner. Performed at [facility], [address] [place], [place] [address]    Ct Head Wo Contrast  Result Date: 04/25/2019 CLINICAL DATA:  Status post unwitnessed fall, hypotensive. EXAM: CT HEAD WITHOUT CONTRAST TECHNIQUE: Contiguous axial images were obtained from the base of the skull through the vertex without intravenous contrast. COMPARISON:  Head CT dated 09/30/2014. FINDINGS: Brain: Generalized age related parenchymal volume loss with commensurate dilatation of the ventricles and sulci. Mild chronic small vessel ischemic changes within the periventricular white matter regions bilaterally. Ventricles are stable in size. No mass, hemorrhage, edema or other evidence of acute parenchymal abnormality. No extra-axial hemorrhage. Vascular: Chronic calcified atherosclerotic changes of the large vessels at the skull base. No unexpected hyperdense vessel. Skull: Normal. Negative for fracture or focal lesion. Sinuses/Orbits: No acute finding. Other: None. IMPRESSION: 1. No acute findings. No intracranial mass, hemorrhage or edema. No skull fracture. 2. Atrophy and chronic ischemic changes in the white matter. Electronically Signed   By: Bary Richard M.D.   On: 04/25/2019 15:52   Dg Chest Portable 1 View  Result Date: 04/25/2019 CLINICAL DATA:  Chest pain after fall.  Diaphoresis. EXAM: PORTABLE CHEST 1 VIEW COMPARISON:  Radiographs 05/22/2016. FINDINGS: 1514 hours. There are slightly lower lung volumes. There is stable cardiomegaly status post median sternotomy. Aortic atherosclerosis and a spinal stimulator are noted. There is vascular congestion with mild bibasilar atelectasis. No edema, confluent airspace opacity, pleural effusion or pneumothorax. No acute osseous findings. Telemetry leads overlie the chest. IMPRESSION: No acute posttraumatic findings identified. Cardiomegaly with vascular congestion and mild bibasilar atelectasis. Electronically  Signed   By: Carey Bullocks M.D.   On: 04/25/2019 15:48    Pending Labs Unresulted Labs (From admission, onward)  Start     Ordered   04/25/19 1700  C difficile quick scan w PCR reflex  (C Difficile quick screen w PCR reflex panel)  Once, for 24 hours,   STAT     04/25/19 1659   04/25/19 1646  MRSA PCR Screening  Once,   STAT     04/25/19 1645   04/25/19 1623  Lactic acid, plasma  Now then every 2 hours,   STAT     04/25/19 1623   04/25/19 1623  Blood Culture (routine x 2)  BLOOD CULTURE X 2,   STAT     04/25/19 1623   04/25/19 1623  Urine culture  ONCE - STAT,   STAT     04/25/19 1623   04/25/19 1508  Urinalysis, Complete w Microscopic  Once,   STAT     04/25/19 1507   04/25/19 1502  Pathologist smear review  Once,   STAT     04/25/19 1502          Vitals/Pain Today's Vitals   04/25/19 1503 04/25/19 1530 04/25/19 1600 04/25/19 1630  BP: (!) 89/50 94/71 (!) 76/36 (!) 72/34  Pulse:   76   Resp:   (!) 32 (!) 27  Temp:      TempSrc:      SpO2: 95%  98%   Weight:      Height:      PainSc:        Isolation Precautions Enteric precautions (UV disinfection)  Medications Medications  metroNIDAZOLE (FLAGYL) IVPB 500 mg (500 mg Intravenous New Bag/Given 04/25/19 1728)  ceFEPIme (MAXIPIME) 2 g in sodium chloride 0.9 % 100 mL IVPB (has no administration in time range)  vancomycin (VANCOCIN) 1,500 mg in sodium chloride 0.9 % 500 mL IVPB (has no administration in time range)  vancomycin (VANCOCIN) 2,500 mg in sodium chloride 0.9 % 500 mL IVPB (has no administration in time range)  sodium chloride 0.9 % bolus 1,000 mL (1,000 mLs Intravenous New Bag/Given 04/25/19 1512)  ceFEPIme (MAXIPIME) 2 g in sodium chloride 0.9 % 100 mL IVPB (0 g Intravenous Stopped 04/25/19 1716)  sodium chloride 0.9 % bolus 1,000 mL (1,000 mLs Intravenous New Bag/Given 04/25/19 1725)    Mobility walks with device Low fall risk   Focused Assessments    R Recommendations: See Admitting Provider  Note  Report given to:

## 2019-04-25 NOTE — ED Notes (Signed)
Patient transported to CT 

## 2019-04-25 NOTE — Progress Notes (Signed)
CODE SEPSIS - PHARMACY COMMUNICATION  **Broad Spectrum Antibiotics should be administered within 1 hour of Sepsis diagnosis**  Time Code Sepsis Called/Page Received: 1623  Antibiotics Ordered: Cefepime + Flagyl + Vancomycin  Time of 1st antibiotic administration: 1634  Additional action taken by pharmacy: None  If necessary, Name of Provider/Nurse Contacted: N/A    Mauri Reading, PharmD Pharmacy Resident  04/25/2019 4:24 PM

## 2019-04-25 NOTE — Consult Note (Addendum)
Name: Caro LarocheHerman Gaither Recupero MRN: [id number] DOB: 09/26/1946    ADMISSION DATE:  04/25/2019 CONSULTATION DATE: 04/25/2019  REFERRING MD : Dr. Cherlynn KaiserSainani  CHIEF COMPLAINT: Fall   BRIEF PATIENT DESCRIPTION:  73 yo male DNR admitted following a fall likely secondary to hypotension in the setting of septic and hypovolemic shock secondary to acute diarrhea due to infectious vs. inflammatory vs. ischemic colitis requiring levophed gtt   SIGNIFICANT EVENTS/STUDIES:  05/25-Pt admitted to the stepdown unit  05/25-CT Abd/Pelvis revealed mild thickening of the descending colon, sigmoid colon, and rectum. This appearance suggests nonspecific infectious, inflammatory,  or ischemic colitis. The proximal colon is fluid-filled and there are dense stool balls in the rectum. Rectal tube is positioned in the rectum. Other chronic and incidental findings as detailed above. 05/25-CT Head no acute findings. No intracranial mass, hemorrhage or edema. No skull fracture. Atrophy and chronic ischemic changes in the white matter  HISTORY OF PRESENT ILLNESS:   This is 73 yo male with a PMH of OSA, HTN, Chronic Diastolic CHF, Type II Diabetes Mellitus, Dementia, CABG, and CAD.  He [facility] ER via EMS on 05/25 following an unwitnessed fall at his nursing facility.  Upon EMS arrival pt found to be hypotensive systolic in the 80's.  Upon arrival to the ER pt alert and disoriented, however he does has a hx of dementia. Pt also had diffuse diarrhea in the ER, pt states this started the afternoon of 05/25.  Initial EKG revealed normal sinus rhythm, hr 83 bpm with a slightly widened QRS complex, and nonspecific ST changes.  Lab results revealed glucose 238, creatinine 1.56, CK 233, troponin <0.03, lactic acid 10.2, wbc 17.0, and hgb 11.2.  CXR concerning for cardiomegaly and vascular congestion, C-diff negative, COVID-19 negative, and UA negative.  CT Abd Pelvis concerning for infectious vs. inflammatory vs. ischemic  colitis.  He remained hypotensive requiring 2L NS bolus and levophed gtt.  Pt also received iv cefepime and vancomycin.  He was subsequently admitted to the stepdown unit for additional workup and treatment.   PAST MEDICAL HISTORY :   has a past medical history of CAD (coronary artery disease), CHF (congestive heart failure) (HCC), Dementia (HCC), Diabetes mellitus without complication (HCC), Diastolic dysfunction, HTN (hypertension), and OSA (obstructive sleep apnea).  has no past surgical history on file. Prior to Admission medications   Medication Sig Start Date End Date Taking? Authorizing Provider  acetaminophen (TYLENOL) 325 MG tablet Take 650 mg by mouth 3 (three) times daily.    Yes [provider]  acetaminophen (TYLENOL) 325 MG tablet Take 650 mg by mouth every 6 (six) hours as needed for mild pain or fever.   Yes [provider]  alum & mag hydroxide-simeth (MAALOX PLUS) 400-400-40 MG/5ML suspension Take 5 mLs by mouth every 6 (six) hours as needed for indigestion (take with lidocaine).   Yes [provider]  carbamide peroxide (DEBROX) 6.5 % OTIC solution Place 10 drops into both ears every Thursday.   Yes [provider]  cetirizine (ZYRTEC) 10 MG tablet Take 10 mg by mouth daily.   Yes [provider]  clopidogrel (PLAVIX) 75 MG tablet Take 75 mg by mouth daily.   Yes [provider]  Dextran 70-Hypromellose 0.1-0.3 % SOLN Place 1 drop into both eyes every 6 (six) hours as needed (dry eyes).   Yes [provider]  diclofenac sodium (VOLTAREN) 1 % GEL Place 2 g onto the skin every 8 (eight) hours as needed (pain).  Yes [provider]  docusate sodium (COLACE) 100 MG capsule Take 100 mg by mouth 2 (two) times daily.   Yes [provider]  DULoxetine (CYMBALTA) 60 MG capsule Take 60 mg by mouth daily.   Yes [provider]  famotidine (PEPCID) 20 MG tablet Take 20 mg by mouth daily.   Yes  [provider]  ferrous sulfate 325 (65 FE) MG tablet Take 325 mg by mouth every Monday, Wednesday, and Friday.   Yes [provider]  fluticasone (FLONASE) 50 MCG/ACT nasal spray Place 1 spray into both nostrils daily.    Yes [provider]  gabapentin (NEURONTIN) 300 MG capsule Take 300 mg by mouth 3 (three) times daily.   Yes [provider]  hydrocortisone 2.5 % cream Apply 1 application topically every 12 (twelve) hours as needed (itching).   Yes [provider]  insulin aspart (NOVOLOG) 100 UNIT/ML injection Inject 25 Units into the skin 3 (three) times daily before meals.   Yes [provider]  insulin glargine (LANTUS) 100 UNIT/ML injection Inject 50 Units into the skin 2 (two) times daily.   Yes [provider]  lidocaine (XYLOCAINE) 2 % solution Use as directed 5 mLs in the mouth or throat every 6 (six) hours as needed for mouth pain (take with Maalox).   Yes [provider]  lidocaine (XYLOCAINE) 5 % ointment Apply 1 application topically every 12 (twelve) hours as needed for mild pain or moderate pain.   Yes [provider]  lisinopril (ZESTRIL) 20 MG tablet Take 10 mg by mouth daily.    Yes [provider]  meclizine (ANTIVERT) 25 MG tablet Take 25 mg by mouth every 8 (eight) hours as needed for dizziness.   Yes [provider]  memantine (NAMENDA) 5 MG tablet Take 1 tablet (5 mg total) by mouth 2 (two) times daily. 05/23/16  Yes Gouru, Deanna Artis, MD  metFORMIN (GLUCOPHAGE) 500 MG tablet Take 500 mg by mouth at bedtime.   Yes [provider]  metFORMIN (GLUCOPHAGE) 500 MG tablet Take 1,000 mg by mouth daily.   Yes [provider]  metoprolol succinate (TOPROL-XL) 50 MG 24 hr tablet Take 50 mg by mouth daily. Take with or immediately following a meal.   Yes [provider]  omeprazole (PRILOSEC) 40 MG capsule Take 40 mg by mouth 2 (two) times a day.   Yes [provider]  ranolazine (RANEXA) 500 MG 12 hr tablet Take 500 mg by mouth 2 (two) times daily.   Yes [provider]  rivaroxaban (XARELTO) 20 MG TABS tablet Take 20 mg by mouth daily with breakfast.    Yes [provider]  rosuvastatin (CRESTOR) 20 MG tablet Take 20 mg by mouth at bedtime.    Yes [provider]  sodium chloride (OCEAN) 0.65 % SOLN nasal spray Place 1 spray into both nostrils every 6 (six) hours as needed (dryness).   Yes [provider]  traMADol (ULTRAM) 50 MG tablet Take 50 mg by mouth every 12 (twelve) hours as needed for moderate pain.   Yes [provider]  triamcinolone cream (KENALOG) 0.1 % Apply 1 application topically every 8 (eight) hours as needed (itching).   Yes [provider]  vitamin B-12 (CYANOCOBALAMIN) 100 MCG tablet Take 200 mcg by mouth daily.   Yes [provider]   Allergies  Allergen Reactions   Atorvastatin Other (See Comments)    Joint swelling   Duloxetine Hcl Rash  Carbamazepine    Lecithin    Oxcarbazepine    Tizanidine Hcl    Yellow Dyes (Non-Tartrazine)     Yellow dye #5 specifically    FAMILY HISTORY:  family history includes Heart disease in his mother. SOCIAL HISTORY:  reports that he has quit smoking. He does not have any smokeless tobacco history on file. He reports that he does not drink alcohol or use drugs.  REVIEW OF SYSTEMS: Positives in BOLD  Constitutional: Negative for fever, chills, weight loss, malaise/fatigue and diaphoresis.  HENT: Negative for hearing loss, ear pain, nosebleeds, congestion, sore throat, neck pain, tinnitus and ear discharge.   Eyes: Negative for blurred vision, double vision, photophobia, pain, discharge and redness.  Respiratory: Negative for cough, hemoptysis, sputum production, shortness of breath, wheezing and stridor.   Cardiovascular: Negative for chest pain, palpitations, orthopnea, claudication, leg swelling and PND.   Gastrointestinal: heartburn, nausea, vomiting, abdominal pain, diarrhea, constipation, blood in stool and melena.  Genitourinary: Negative for dysuria, urgency, frequency, hematuria and flank pain.  Musculoskeletal: Negative for myalgias, back pain, joint pain and falls.  Skin: Negative for itching and rash.  Neurological: Negative for dizziness, tingling, tremors, sensory change, speech change, focal weakness, seizures, loss of consciousness, weakness and headaches.  Endo/Heme/Allergies: Negative for environmental allergies and polydipsia. Does not bruise/bleed easily.  SUBJECTIVE:  Pt c/o abdominal pain worse on the right   VITAL SIGNS: Temp:  [97.7 F (36.5 C)] 97.7 F (36.5 C) (05/25 1458) Pulse Rate:  [76-89] 79 (05/25 1900) Resp:  [27-32] 30 (05/25 1900) BP: (72-94)/(34-71) 80/59 (05/25 1900) SpO2:  [95 %-99 %] 99 % (05/25 1900) Weight:  [137.9 kg] 137.9 kg (05/25 1459)  PHYSICAL EXAMINATION: General: well developed male resting in bed, NAD  Neuro: alert and oriented, follows commands  HEENT: supple, no JVD  Cardiovascular: nsr, rrr, no R/G  Lungs: clear throughout, even, non labored  Abdomen: +BS x4, obese, soft, tenderness worse on the right  Musculoskeletal: normal bulk and tone, no edema  Skin: intact no rashes or lesions present   Recent Labs  Lab 04/25/19 1502  NA 138  K 4.0  CL 103  CO2 17*  BUN 18  CREATININE 1.56*  GLUCOSE 238*   Recent Labs  Lab 04/25/19 1502  HGB 11.2*  HCT 40.4  WBC 17.0*  PLT 408*   Ct Abdomen Pelvis Wo Contrast  Result Date: 04/25/2019 CLINICAL DATA:  Hypotension, elevated WBC, diarrhea, abdominal pain EXAM: CT ABDOMEN AND PELVIS WITHOUT CONTRAST TECHNIQUE: Multidetector CT imaging of the abdomen and pelvis was performed following the standard protocol without IV contrast. COMPARISON:  None. FINDINGS: Lower chest: Cardiomegaly and coronary artery calcifications. Hepatobiliary: No focal liver abnormality is seen. Hepatic  steatosis. Gallstones. No gallbladder wall thickening, or biliary dilatation. Pancreas: Unremarkable. No pancreatic ductal dilatation or surrounding inflammatory changes. Spleen: Normal in size without focal abnormality. Adrenals/Urinary Tract: Adrenal glands are unremarkable. Kidneys are normal, without renal calculi, focal lesion, or hydronephrosis. Bladder is unremarkable. Stomach/Bowel: Stomach is within normal limits. Appendix appears normal. There is mild thickening of the descending colon, sigmoid colon, and rectum. The proximal colon is fluid-filled and there are dense stool balls in the rectum. Rectal tube is positioned in the rectum. Vascular/Lymphatic: Scattered calcific atherosclerosis. No enlarged abdominal or pelvic lymph nodes. Reproductive: No mass or other abnormality. Other: Small fat containing bilateral inguinal hernias. No abdominopelvic ascites. Musculoskeletal: No acute or significant osseous findings. IMPRESSION: 1. There is mild thickening of the descending colon, sigmoid colon, and rectum.  This appearance suggests nonspecific infectious, inflammatory, or ischemic colitis. The proximal colon is fluid-filled and there are dense stool balls in the rectum. Rectal tube is positioned in the rectum. 2.  Other chronic and incidental findings as detailed above. Electronically Signed   By: Lauralyn Primes M.D.   On: 04/25/2019 18:38   Ct Head Wo Contrast  Result Date: 04/25/2019 CLINICAL DATA:  Status post unwitnessed fall, hypotensive. EXAM: CT HEAD WITHOUT CONTRAST TECHNIQUE: Contiguous axial images were obtained from the base of the skull through the vertex without intravenous contrast. COMPARISON:  Head CT dated 09/30/2014. FINDINGS: Brain: Generalized age related parenchymal volume loss with commensurate dilatation of the ventricles and sulci. Mild chronic small vessel ischemic changes within the periventricular white matter regions bilaterally. Ventricles are stable in size. No mass,  hemorrhage, edema or other evidence of acute parenchymal abnormality. No extra-axial hemorrhage. Vascular: Chronic calcified atherosclerotic changes of the large vessels at the skull base. No unexpected hyperdense vessel. Skull: Normal. Negative for fracture or focal lesion. Sinuses/Orbits: No acute finding. Other: None. IMPRESSION: 1. No acute findings. No intracranial mass, hemorrhage or edema. No skull fracture. 2. Atrophy and chronic ischemic changes in the white matter. Electronically Signed   By: Bary Richard M.D.   On: 04/25/2019 15:52   Dg Chest Portable 1 View  Result Date: 04/25/2019 CLINICAL DATA:  Chest pain after fall.  Diaphoresis. EXAM: PORTABLE CHEST 1 VIEW COMPARISON:  Radiographs 05/22/2016. FINDINGS: 1514 hours. There are slightly lower lung volumes. There is stable cardiomegaly status post median sternotomy. Aortic atherosclerosis and a spinal stimulator are noted. There is vascular congestion with mild bibasilar atelectasis. No edema, confluent airspace opacity, pleural effusion or pneumothorax. No acute osseous findings. Telemetry leads overlie the chest. IMPRESSION: No acute posttraumatic findings identified. Cardiomegaly with vascular congestion and mild bibasilar atelectasis. Electronically Signed   By: Carey Bullocks M.D.   On: 04/25/2019 15:48    ASSESSMENT / PLAN:  Hypotension secondary to sepsis and hypovolemia  Hx: HTN, CAD, and Chronic diastolic CHF  Continuous telemetry monitoring  Aggressive fluid resuscitation and prn levophed gtt to maintain map >65  Hold outpatient antihypertensives  Continue rosuvastatin   Acute renal failure secondary to hypotension  Severe lactic acidosis  Trend BMP and lactic acid Replace electrolytes as indicated  Monitor UOP  Avoid nephrotoxic medications  LR @100  ml/hr   Acute diarrhea secondary to infectious vs. inflammatory vs. ischemic colitis Trend WBC and monitor fever curve  Trend PCT  Continue cipro and flagyl for now   Follow cultures and GI panel results Clear liquid diet   Anemia without obvious acute blood loss  VTE px: SCD's Trend CBC  Monitor for s/sx of bleeding and transfuse for hgb <7  Type II Diabetes mellitus  CBG's ac/hs  SSI   Fall Dementia  Continue outpatient namenda  Fall precautions   Sonda Rumble, AGNP  Pulmonary/Critical Care Pager [phone] (please enter 7 digits) PCCM Consult Pager [phone] (please enter 7 digits)

## 2019-04-25 NOTE — ED Notes (Signed)
EDP made aware of pt's hypotension.

## 2019-04-25 NOTE — ED Provider Notes (Addendum)
[facility] Emergency Department Provider Note  Time seen: 3:10 PM  I have reviewed the triage vital signs and the nursing notes.   HISTORY  Chief Complaint Fall   HPI Thomas Watkins is a 73 y.o. male a past medical history of CAD, CABG, CHF, dementia, diabetes, hypertension, presents to the emergency department after a fall found to be hypotensive by EMS.  According to EMS they were called to the patient's nursing facility after an unwitnessed fall.  Patient found to be hypotensive 80 systolic by EMS.  Here patient is somewhat pale and somewhat cool to the touch.  Patient is awake and alert, he is disoriented however has a history of dementia and this could very likely be baseline.  Patient is unable to contribute to his past medical history or review of systems at this time.   Past Medical History:  Diagnosis Date  . CAD (coronary artery disease)    a. CABG 2000; b. multiple stents; c. cardiac cath 02/05/2015: occluded mLAD, patent mid to distal LAD after the 100% occlusion with free LIMA to the mid LAD,  patent mid LAD stent w/ mild ISR, patent proximal to mid LAD stent before 100% occlusion, LCx & RCA with mild diffuse disease. EF >55%. No intervention needed. Medical management. CP likely not cardiac.   Marland Kitchen CHF (congestive heart failure) (HCC)   . Dementia (HCC)   . Diabetes mellitus without complication (HCC)   . Diastolic dysfunction    a. echo 01/2015: EF 60-65%, diastolic dysfunction, mild LVH, mildly dilated LA, mild AS (possibly calcified), mild TR,   . HTN (hypertension)   . OSA (obstructive sleep apnea)     Patient Active Problem List   Diagnosis Date Noted  . Atrial fibrillation with RVR (HCC) 05/20/2016  . CAD (coronary artery disease)   . Diastolic dysfunction   . HTN (hypertension)   . OSA (obstructive sleep apnea)   . Dementia (HCC)   . B12 deficiency 10/16/2014  . Avitaminosis D 10/08/2014  . Chronic pain 10/08/2014  . Benign  prostatic hypertrophy without urinary obstruction 10/06/2014  . General psychoses (HCC) 10/05/2014  . Encounter for surgical follow-up care 12/02/2012  . Adiposity 07/08/2012  . Carotid artery obstruction 07/08/2012  . Chest pain 07/08/2012  . Chronic ischemic heart disease 07/08/2012  . Extremity pain 07/08/2012  . Combined fat and carbohydrate induced hyperlipemia 07/08/2012  . Prinzmetal angina (HCC) 07/08/2012    History reviewed. No pertinent surgical history.  Prior to Admission medications   Medication Sig Start Date End Date Taking? Authorizing Provider  acetaminophen (TYLENOL) 325 MG tablet Take 650 mg by mouth every 6 (six) hours as needed.    [provider]  albuterol (2.5 MG/3ML) 0.083% NEBU 3 mL, albuterol (5 MG/ML) 0.5% NEBU 0.5 mL Inhale into the lungs.    [provider]  ALBUTEROL SULFATE IN Inhale into the lungs.    [provider]  benzonatate (TESSALON) 200 MG capsule Take 200 mg by mouth 3 (three) times daily as needed for cough.    [provider]  cetirizine (ZYRTEC) 10 MG tablet Take 10 mg by mouth daily.    [provider]  Dextrose, Diabetic Use, (DEXTROSE PO) Take by mouth.    [provider]  Diclofenac Sodium 1 % CREA Place onto the skin.    [provider]  docusate sodium (COLACE) 100 MG capsule Take 100 mg by mouth 2 (two) times daily.    [provider]  donepezil (ARICEPT) 5 MG tablet Take 5 mg by mouth daily.    [provider]  fluticasone (FLONASE) 50 MCG/ACT nasal spray Place into both nostrils daily.    [provider]  furosemide (LASIX) 20 MG tablet Take 1 tablet (20 mg total) by mouth 2 (two) times daily. 05/23/16   Ramonita LabGouru, Aruna, MD  hydrOXYzine (ATARAX/VISTARIL) 25 MG tablet Take 25 mg by mouth 3 (three) times daily as needed for anxiety.    [provider]  insulin NPH-regular Human (NOVOLIN 70/30) (70-30) 100 UNIT/ML injection Inject into the  skin.    [provider]  isosorbide mononitrate (IMDUR) 120 MG 24 hr tablet Take 120 mg by mouth daily.    [provider]  lidocaine (XYLOCAINE) 2 % jelly 1 application as needed.    [provider]  lisinopril (PRINIVIL,ZESTRIL) 10 MG tablet Take 10 mg by mouth daily.    [provider]  memantine (NAMENDA) 5 MG tablet Take 1 tablet (5 mg total) by mouth 2 (two) times daily. 05/23/16   Gouru, Deanna ArtisAruna, MD  Menthol-Methyl Salicylate (THERA-GESIC) 1-15 % CREA Apply topically.    [provider]  metFORMIN (GLUCOPHAGE-XR) 500 MG 24 hr tablet Take 500 mg by mouth daily with breakfast.    [provider]  metoprolol succinate (TOPROL-XL) 25 MG 24 hr tablet Take 2 tablets (50 mg total) by mouth daily. 05/23/16   Ramonita LabGouru, Aruna, MD  nitroGLYCERIN (NITROSTAT) 0.4 MG SL tablet Place 0.4 mg under the tongue every 5 (five) minutes as needed for chest pain.    [provider]  risperiDONE (RISPERDAL) 0.25 MG tablet Take 1 tablet (0.25 mg total) by mouth at bedtime. 05/23/16   Ramonita LabGouru, Aruna, MD  rivaroxaban (XARELTO) 20 MG TABS tablet Take 20 mg by mouth daily with supper.    [provider]  rosuvastatin (CRESTOR) 10 MG tablet Take 10 mg by mouth daily.    [provider]  rosuvastatin (CRESTOR) 20 MG tablet Take 20 mg by mouth daily.    [provider]  senna (SENOKOT) 8.6 MG TABS tablet Take 1 tablet (8.6 mg total) by mouth 2 (two) times daily. 05/23/16   Ramonita LabGouru, Aruna, MD  traZODone (DESYREL) 50 MG tablet Take 50 mg by mouth at bedtime.    [provider]  venlafaxine XR (EFFEXOR-XR) 75 MG 24 hr capsule Take 1 capsule (75 mg total) by mouth daily with breakfast. 05/23/16   Ramonita LabGouru, Aruna, MD    Allergies  Allergen Reactions  . Atorvastatin Other (See Comments)    Joint swelling  . Duloxetine Hcl Rash  . Carbamazepine   . Lecithin   . Oxcarbazepine   . Tizanidine Hcl   . Yellow Dyes (Non-Tartrazine)     Yellow  dye #5 specifically    History reviewed. No pertinent family history.  Social History Social History   Tobacco Use  . Smoking status: Former Smoker  Substance Use Topics  . Alcohol use: Not on file  . Drug use: Not on file    Review of Systems Unable to obtain accurate/accurate review of systems secondary to altered mental status  ____________________________________________   PHYSICAL EXAM:  VITAL SIGNS: ED Triage Vitals  Enc Vitals Group     BP 04/25/19 1501 (!) 89/50     Pulse Rate 04/25/19 1458 89     Resp 04/25/19 1458 (!) 30     Temp 04/25/19 1458 97.7 F (36.5 C)     Temp Source 04/25/19 1458 Rectal  SpO2 04/25/19 1501 95 %     Weight 04/25/19 1459 (!) 304 lb (137.9 kg)     Height 04/25/19 1459 6' (1.829 m)     Head Circumference --      Peak Flow --      Pain Score 04/25/19 1459 0     Pain Loc --      Pain Edu? --      Excl. in GC? --    Constitutional: Patient is awake and alert, he is disoriented to time. Eyes: Normal exam ENT      Head: Normocephalic and atraumatic      Mouth/Throat: Mucous membranes are moist. Cardiovascular: Normal rate, regular rhythm. Respiratory: Normal respiratory effort without tachypnea nor retractions. Breath sounds are clear  Gastrointestinal: Soft and nontender. No distention.   Musculoskeletal: Nontender with normal range of motion in all extremities Neurologic:  Normal speech and language. No gross focal neurologic deficits Skin:  Skin is warm, dry and intact.  Psychiatric: Mood and affect are normal.  ____________________________________________    EKG  EKG viewed and interpreted by myself shows a normal sinus rhythm 83 bpm with a slightly widened QRS, normal axis, largely normal intervals besides slight PR prolongation, nonspecific ST changes.  ____________________________________________    RADIOLOGY  CT head negative. Chest x-ray shows vascular  congestion.  ____________________________________________   INITIAL IMPRESSION / ASSESSMENT AND PLAN / ED COURSE  Pertinent labs & imaging results that were available during my care of the patient were reviewed by me and considered in my medical decision making (see chart for details).   Patient presents to the emergency department after a fall found to be hypotensive by EMS, 80 systolic.  Upon arrival patient is awake and alert, he is answering questions but he is disoriented, has dementia at baseline cannot adequately contribute to his history at this time.  Patient is obese, somewhat pale and cool to the touch.  Differential is quite broad at this time would include infectious etiology, traumatic injury, metabolic abnormality, ACS.  We will check labs including cardiac enzymes, dose IV fluids, obtain a chest x-ray, CT scan of the head as a precaution and continue to closely monitor.  Patient agreeable to plan of care patient's blood pressure currently 89/50.  Patient remains hypotensive, receiving fluids.  We are covering with broad-spectrum antibiotics given the patient's leukocytosis and hypotension.  Patient is quite obese which could make blood pressure readings somewhat erroneous.  Patient continues to [facility] well.  Patient is now having profuse diarrhea as well.  We will send for C. difficile test.  We will place a rectal tube.  We will obtain a CT scan abdomen/pelvis as a precaution.  Patient admitted to the hospitalist service.  I discussed the patient's work-up with the patient's wife.  She would like the patient to go the [place] if at all possible however I discussed given his current critical condition with significant hypotension elevated lactic acid most consistent with sepsis we will need to admit locally to stabilize the patient.  She is agreeable to this.  Thomas Watkins was evaluated in Emergency Department on 04/25/2019 for the symptoms described in the history of present  illness. He was evaluated in the context of the global COVID-19 pandemic, which necessitated consideration that the patient might be at risk for infection with the SARS-CoV-2 virus that causes COVID-19. Institutional protocols and algorithms that pertain to the evaluation of patients at risk for COVID-19 are in a state of rapid change based  on information released by regulatory bodies including the CDC and federal and state organizations. These policies and algorithms were followed during the patient's care in the ED.  CRITICAL CARE Performed by: Minna Antis   Total critical care time: 30 minutes  Critical care time was exclusive of separately billable procedures and treating other patients.  Critical care was necessary to treat or prevent imminent or life-threatening deterioration.  Critical care was time spent personally by me on the following activities: development of treatment plan with patient and/or surrogate as well as nursing, discussions with consultants, evaluation of patient's response to treatment, examination of patient, obtaining history from patient or surrogate, ordering and performing treatments and interventions, ordering and review of laboratory studies, ordering and review of radiographic studies, pulse oximetry and re-evaluation of patient's condition.   ____________________________________________   FINAL CLINICAL IMPRESSION(S) / ED DIAGNOSES  Fall Hypotension Diarrhea   Minna Antis, MD 04/25/19 1755    Minna Antis, MD 04/25/19 1756    Minna Antis, MD 04/25/19 Barry Brunner

## 2019-04-25 NOTE — Progress Notes (Signed)
eLink Physician-Brief Progress Note Patient Name: Thomas Watkins DOB: [birth year] MRN: [id number]   Date of Service  04/25/2019  HPI/Events of Note  73 yo male DNR admitted following a fall likely secondary to hypotension in the setting of septic and hypovolemic shock secondary to acute diarrhea due to infectious vs. inflammatory vs. ischemic colitis requiring levophed gtt. VSS.  eICU Interventions  No new orders.      Intervention Category Evaluation Type: New Patient Evaluation  Lenell Antu 04/25/2019, 8:52 PM

## 2019-04-25 NOTE — ED Notes (Addendum)
ED TO INPATIENT HANDOFF REPORT  ED Nurse Name and Phone #: [name]  S Name/Age/Gender Thomas LarocheHerman Gaither Watkins 73 y.o. male Room/Bed: ED25A/ED25A  Code Status   Code Status: Prior  Home/SNF/Other Skilled nursing facility Patient oriented to: self Is this baseline? altered mental status but confused at baseline  Triage Complete: Triage complete  Chief Complaint fall  Triage Note Pt arrived via [facility] from [facility] r/t a fall, pt also hypotensive. Fall was unwitnessed, pt pale and diaphoretic.    Allergies Allergies  Allergen Reactions  . Atorvastatin Other (See Comments)    Joint swelling  . Duloxetine Hcl Rash  . Carbamazepine   . Lecithin   . Oxcarbazepine   . Tizanidine Hcl   . Yellow Dyes (Non-Tartrazine)     Yellow dye #5 specifically    Level of Care/Admitting Diagnosis ED Disposition    ED Disposition Condition Comment   Admit  Hospital Area: [facility] [[id number]]  Level of Care: Stepdown [14]  Covid Evaluation: Screening Protocol (No Symptoms)  Diagnosis: Septic shock [facility](HCC) [[id number]]  Admitting Physician: Houston SirenSAINANI, VIVEK J [[id number]]  Attending Physician: Houston SirenSAINANI, VIVEK J [[id number]]  Estimated length of stay: past midnight tomorrow  Certification:: I certify this patient will need inpatient services for at least 2 midnights  PT Class (Do Not Modify): Inpatient [101]  PT Acc Code (Do Not Modify): Private [1]       B Medical/Surgery History Past Medical History:  Diagnosis Date  . CAD (coronary artery disease)    a. CABG 2000; b. multiple stents; c. cardiac cath 02/05/2015: occluded mLAD, patent mid to distal LAD after the 100% occlusion with free LIMA to the mid LAD,  patent mid LAD stent w/ mild ISR, patent proximal to mid LAD stent before 100% occlusion, LCx & RCA with mild diffuse disease. EF >55%. No intervention needed. Medical management. CP likely not cardiac.   Thomas Watkins. CHF (congestive heart failure) (HCC)   . Dementia (HCC)   . Diabetes  mellitus without complication (HCC)   . Diastolic dysfunction    a. echo 01/2015: EF 60-65%, diastolic dysfunction, mild LVH, mildly dilated LA, mild AS (possibly calcified), mild TR,   . HTN (hypertension)   . OSA (obstructive sleep apnea)    History reviewed. No pertinent surgical history.   A IV Location/Drains/Wounds Patient Lines/Drains/Airways Status   Active Line/Drains/Airways    Name:   Placement date:   Placement time:   Site:   Days:   Peripheral IV 05/20/16 Right Forearm   05/20/16    -    Forearm   1070   Peripheral IV Left Antecubital   -    -    Antecubital      Peripheral IV 04/25/19 Right Hand   04/25/19    1507    Hand   less than 1   Rectal Tube/Pouch   04/25/19    1755    -   less than 1          Intake/Output Last 24 hours No intake or output data in the 24 hours ending 04/25/19 1905  Labs/Imaging Results for orders placed or performed during the hospital encounter of 04/25/19 (from the past 48 hour(s))  CBC with Differential     Status: Abnormal   Collection Time: 04/25/19  3:02 PM  Result Value Ref Range   WBC 17.0 (H) 4.0 - 10.5 K/uL   RBC 5.30 4.22 - 5.81 MIL/uL   Hemoglobin 11.2 (L) 13.0 - 17.0 g/dL  HCT 40.4 39.0 - 52.0 %   MCV 76.2 (L) 80.0 - 100.0 fL   MCH 21.1 (L) 26.0 - 34.0 pg   MCHC 27.7 (L) 30.0 - 36.0 g/dL   RDW 48.2 (H) 70.7 - 86.7 %   Platelets 408 (H) 150 - 400 K/uL   nRBC 0.1 0.0 - 0.2 %   Neutrophils Relative % 53 %   Neutro Abs 9.1 (H) 1.7 - 7.7 K/uL   Lymphocytes Relative 40 %   Lymphs Abs 6.9 (H) 0.7 - 4.0 K/uL   Monocytes Relative 5 %   Monocytes Absolute 0.8 0.1 - 1.0 K/uL   Eosinophils Relative 1 %   Eosinophils Absolute 0.1 0.0 - 0.5 K/uL   Basophils Relative 0 %   Basophils Absolute 0.0 0.0 - 0.1 K/uL   WBC Morphology Abnormal lymphocytes present    Immature Granulocytes 1 %   Abs Immature Granulocytes 0.09 (H) 0.00 - 0.07 K/uL    Comment: Performed at [facility], [address] [place], [place]  [address]  Comprehensive metabolic panel     Status: Abnormal   Collection Time: 04/25/19  3:02 PM  Result Value Ref Range   Sodium 138 135 - 145 mmol/L   Potassium 4.0 3.5 - 5.1 mmol/L   Chloride 103 98 - 111 mmol/L   CO2 17 (L) 22 - 32 mmol/L   Glucose, Bld 238 (H) 70 - 99 mg/dL   BUN 18 8 - 23 mg/dL   Creatinine, Ser 0.10 (H) 0.61 - 1.24 mg/dL   Calcium 8.9 8.9 - 07.1 mg/dL   Total Protein 7.3 6.5 - 8.1 g/dL   Albumin 3.7 3.5 - 5.0 g/dL   AST 38 15 - 41 U/L   ALT 21 0 - 44 U/L   Alkaline Phosphatase 91 38 - 126 U/L   Total Bilirubin 0.6 0.3 - 1.2 mg/dL   GFR calc non Af Amer 43 (L) >60 mL/min   GFR calc Af Amer 50 (L) >60 mL/min   Anion gap 18 (H) 5 - 15    Comment: Performed at [facility], [address] [place], [place] [address]  Troponin I - ONCE - STAT     Status: None   Collection Time: 04/25/19  3:02 PM  Result Value Ref Range   Troponin I <0.03 <0.03 ng/mL    Comment: Performed at [facility], [address] [place], [place] [address]  Lipase, blood     Status: None   Collection Time: 04/25/19  3:02 PM  Result Value Ref Range   Lipase 34 11 - 51 U/L    Comment: Performed at [facility], [address] [place], [place] [address]  CK     Status: None   Collection Time: 04/25/19  3:02 PM  Result Value Ref Range   Total CK 233 49 - 397 U/L    Comment: Performed at [facility], [address], [place], [place] [address]  Sample to Blood Bank     Status: None   Collection Time: 04/25/19  3:02 PM  Result Value Ref Range   Blood Bank Specimen SAMPLE AVAILABLE FOR TESTING    Sample Expiration      04/28/2019,2359 Performed at [facility], [address] [place], [place] [address]   SARS Coronavirus 2 (CEPHEID- Performed in [facility] hospital lab), Hosp Order     Status: None   Collection Time: 04/25/19  3:09 PM  Result Value Ref Range  SARS Coronavirus 2 NEGATIVE NEGATIVE    Comment: (NOTE) If result is  NEGATIVE SARS-CoV-2 target nucleic acids are NOT DETECTED. The SARS-CoV-2 RNA is generally detectable in upper and lower  respiratory specimens during the acute phase of infection. The lowest  concentration of SARS-CoV-2 viral copies this assay can detect is 250  copies / mL. A negative result does not preclude SARS-CoV-2 infection  and should not be used as the sole basis for treatment or other  patient management decisions.  A negative result may occur with  improper specimen collection / handling, submission of specimen other  than nasopharyngeal swab, presence of viral mutation(s) within the  areas targeted by this assay, and inadequate number of viral copies  (<250 copies / mL). A negative result must be combined with clinical  observations, patient history, and epidemiological information. If result is POSITIVE SARS-CoV-2 target nucleic acids are DETECTED. The SARS-CoV-2 RNA is generally detectable in upper and lower  respiratory specimens dur ing the acute phase of infection.  Positive  results are indicative of active infection with SARS-CoV-2.  Clinical  correlation with patient history and other diagnostic information is  necessary to determine patient infection status.  Positive results do  not rule out bacterial infection or co-infection with other viruses. If result is PRESUMPTIVE POSTIVE SARS-CoV-2 nucleic acids MAY BE PRESENT.   A presumptive positive result was obtained on the submitted specimen  and confirmed on repeat testing.  While 2019 novel coronavirus  (SARS-CoV-2) nucleic acids may be present in the submitted sample  additional confirmatory testing may be necessary for epidemiological  and / or clinical management purposes  to differentiate between  SARS-CoV-2 and other Sarbecovirus currently known to infect humans.  If clinically indicated additional testing with an alternate test  methodology [phone]) is advised. The SARS-CoV-2 RNA is generally  detectable  in upper and lower respiratory sp ecimens during the acute  phase of infection. The expected result is Negative. Fact Sheet for Patients:  BoilerBrush.com.cy Fact Sheet for Healthcare Providers: https://pope.com/ This test is not yet approved or cleared by the [place] FDA and has been authorized for detection and/or diagnosis of SARS-CoV-2 by FDA under an Emergency Use Authorization (EUA).  This EUA will remain in effect (meaning this test can be used) for the duration of the COVID-19 declaration under Section 564(b)(1) of the Act, 21 U.S.C. section 360bbb-3(b)(1), unless the authorization is terminated or revoked sooner. Performed at [facility], [address] [place], [place] [address]   Urinalysis, Complete w Microscopic     Status: Abnormal   Collection Time: 04/25/19  5:09 PM  Result Value Ref Range   Color, Urine AMBER (A) YELLOW    Comment: BIOCHEMICALS MAY BE AFFECTED BY COLOR   APPearance CLOUDY (A) CLEAR   Specific Gravity, Urine 1.024 1.005 - 1.030   pH 5.0 5.0 - 8.0   Glucose, UA NEGATIVE NEGATIVE mg/dL   Hgb urine dipstick MODERATE (A) NEGATIVE   Bilirubin Urine NEGATIVE NEGATIVE   Ketones, ur NEGATIVE NEGATIVE mg/dL   Protein, ur 811 (A) NEGATIVE mg/dL   Nitrite NEGATIVE NEGATIVE   Leukocytes,Ua NEGATIVE NEGATIVE   RBC / HPF 21-50 0 - 5 RBC/hpf   WBC, UA 6-10 0 - 5 WBC/hpf   Bacteria, UA NONE SEEN NONE SEEN   Squamous Epithelial / LPF 0-5 0 - 5   Mucus PRESENT    Hyaline Casts, UA PRESENT     Comment: Performed at [facility], [address] [place], [place] [address]  C difficile quick scan w PCR reflex     Status: None   Collection Time: 04/25/19  5:09 PM  Result Value Ref Range   C Diff antigen NEGATIVE NEGATIVE   C Diff toxin NEGATIVE NEGATIVE   C Diff interpretation No C. difficile detected.     Comment: Performed at [facility], [address] [place], [place]  [address]  Lactic acid, plasma     Status: Abnormal   Collection Time: 04/25/19  5:30 PM  Result Value Ref Range   Lactic Acid, Venous 10.2 (HH) 0.5 - 1.9 mmol/L    Comment: CRITICAL RESULT CALLED TO, READ BACK BY AND VERIFIED WITH GREG MOYER AT 1816 ON 04/25/2019 JJB Performed at [facility], [address] [place], [place] [address]    Ct Abdomen Pelvis Wo Contrast  Result Date: 04/25/2019 CLINICAL DATA:  Hypotension, elevated WBC, diarrhea, abdominal pain EXAM: CT ABDOMEN AND PELVIS WITHOUT CONTRAST TECHNIQUE: Multidetector CT imaging of the abdomen and pelvis was performed following the standard protocol without IV contrast. COMPARISON:  None. FINDINGS: Lower chest: Cardiomegaly and coronary artery calcifications. Hepatobiliary: No focal liver abnormality is seen. Hepatic steatosis. Gallstones. No gallbladder wall thickening, or biliary dilatation. Pancreas: Unremarkable. No pancreatic ductal dilatation or surrounding inflammatory changes. Spleen: Normal in size without focal abnormality. Adrenals/Urinary Tract: Adrenal glands are unremarkable. Kidneys are normal, without renal calculi, focal lesion, or hydronephrosis. Bladder is unremarkable. Stomach/Bowel: Stomach is within normal limits. Appendix appears normal. There is mild thickening of the descending colon, sigmoid colon, and rectum. The proximal colon is fluid-filled and there are dense stool balls in the rectum. Rectal tube is positioned in the rectum. Vascular/Lymphatic: Scattered calcific atherosclerosis. No enlarged abdominal or pelvic lymph nodes. Reproductive: No mass or other abnormality. Other: Small fat containing bilateral inguinal hernias. No abdominopelvic ascites. Musculoskeletal: No acute or significant osseous findings. IMPRESSION: 1. There is mild thickening of the descending colon, sigmoid colon, and rectum. This appearance suggests nonspecific infectious, inflammatory, or ischemic colitis. The proximal colon is  fluid-filled and there are dense stool balls in the rectum. Rectal tube is positioned in the rectum. 2.  Other chronic and incidental findings as detailed above. Electronically Signed   By: Lauralyn Primes M.D.   On: 04/25/2019 18:38   Ct Head Wo Contrast  Result Date: 04/25/2019 CLINICAL DATA:  Status post unwitnessed fall, hypotensive. EXAM: CT HEAD WITHOUT CONTRAST TECHNIQUE: Contiguous axial images were obtained from the base of the skull through the vertex without intravenous contrast. COMPARISON:  Head CT dated 09/30/2014. FINDINGS: Brain: Generalized age related parenchymal volume loss with commensurate dilatation of the ventricles and sulci. Mild chronic small vessel ischemic changes within the periventricular white matter regions bilaterally. Ventricles are stable in size. No mass, hemorrhage, edema or other evidence of acute parenchymal abnormality. No extra-axial hemorrhage. Vascular: Chronic calcified atherosclerotic changes of the large vessels at the skull base. No unexpected hyperdense vessel. Skull: Normal. Negative for fracture or focal lesion. Sinuses/Orbits: No acute finding. Other: None. IMPRESSION: 1. No acute findings. No intracranial mass, hemorrhage or edema. No skull fracture. 2. Atrophy and chronic ischemic changes in the white matter. Electronically Signed   By: Bary Richard M.D.   On: 04/25/2019 15:52   Dg Chest Portable 1 View  Result Date: 04/25/2019 CLINICAL DATA:  Chest pain after fall.  Diaphoresis. EXAM: PORTABLE CHEST 1 VIEW COMPARISON:  Radiographs 05/22/2016. FINDINGS: 1514 hours. There are slightly lower lung volumes. There is stable cardiomegaly  status post median sternotomy. Aortic atherosclerosis and a spinal stimulator are noted. There is vascular congestion with mild bibasilar atelectasis. No edema, confluent airspace opacity, pleural effusion or pneumothorax. No acute osseous findings. Telemetry leads overlie the chest. IMPRESSION: No acute posttraumatic findings  identified. Cardiomegaly with vascular congestion and mild bibasilar atelectasis. Electronically Signed   By: Carey Bullocks M.D.   On: 04/25/2019 15:48    Pending Labs Unresulted Labs (From admission, onward)    Start     Ordered   04/25/19 1900  Gastrointestinal Panel by PCR , Stool  (Gastrointestinal Panel by PCR, Stool)  Add-on,   AD     04/25/19 1859   04/25/19 1646  MRSA PCR Screening  Once,   STAT     04/25/19 1645   04/25/19 1623  Lactic acid, plasma  Now then every 2 hours,   STAT     04/25/19 1623   04/25/19 1623  Blood Culture (routine x 2)  BLOOD CULTURE X 2,   STAT     04/25/19 1623   04/25/19 1623  Urine culture  ONCE - STAT,   STAT     04/25/19 1623   04/25/19 1502  Pathologist smear review  Once,   STAT     04/25/19 1502   Signed and Held  Basic metabolic panel  Tomorrow morning,   R     Signed and Held   Signed and Held  CBC  Tomorrow morning,   R     Signed and Held          Vitals/Pain Today's Vitals   04/25/19 1530 04/25/19 1600 04/25/19 1630 04/25/19 1900  BP: 94/71 (!) 76/36 (!) 72/34 (!) 80/59  Pulse:  76  79  Resp:  (!) 32 (!) 27 (!) 30  Temp:      TempSrc:      SpO2:  98%  99%  Weight:      Height:      PainSc:        Isolation Precautions Enteric precautions (UV disinfection)  Medications Medications  ceFEPIme (MAXIPIME) 2 g in sodium chloride 0.9 % 100 mL IVPB (has no administration in time range)  vancomycin (VANCOCIN) 1,500 mg in sodium chloride 0.9 % 500 mL IVPB (has no administration in time range)  vancomycin (VANCOCIN) 2,500 mg in sodium chloride 0.9 % 500 mL IVPB (2,500 mg Intravenous New Bag/Given 04/25/19 1849)  sodium chloride 0.9 % bolus 1,000 mL (0 mLs Intravenous Stopped 04/25/19 1905)  ceFEPIme (MAXIPIME) 2 g in sodium chloride 0.9 % 100 mL IVPB (0 g Intravenous Stopped 04/25/19 1716)  metroNIDAZOLE (FLAGYL) IVPB 500 mg (0 mg Intravenous Stopped 04/25/19 1848)  sodium chloride 0.9 % bolus 1,000 mL (0 mLs Intravenous Stopped  04/25/19 1905)    Mobility non-ambulatory Low fall risk   Focused Assessments sepsis   R Recommendations: See Admitting Provider Note  Report given to:   Additional Notes:  Severe diarrhea/ severe hypotension

## 2019-04-25 NOTE — Consult Note (Signed)
Pharmacy Antibiotic Note  Thomas Watkins is a 73 y.o. male admitted on 04/25/2019 with sepsis. Patient presents from a nursing facility after found unresponsive and hypotensive after a fall. Currently source of sepsis is unknown. Last known baseline Scr is ~0.9; however, this was in 2017. Pharmacy has been consulted for Cefepime + Vancomycin dosing.  Plan: Ordered a Vancomycin 2500 mg load and patient received Cefepime 2 g x 1 in ED.  Vancomycin Maintenance Ordered Vancomycin 1500 mg q24h starting 5/26 @ 1800 Using Scr 1.56, Vd 0.5 (BMI > 30) Goal AUC 400-550 Anticipated AUC 522.0   Height: 6' (182.9 cm) Weight: (!) 304 lb (137.9 kg) IBW/kg (Calculated) : 77.6  Temp (24hrs), Avg:97.7 F (36.5 C), Min:97.7 F (36.5 C), Max:97.7 F (36.5 C)  Recent Labs  Lab 04/25/19 1502  WBC 17.0*  CREATININE 1.56*    Estimated Creatinine Clearance: 58.9 mL/min (A) (by C-G formula based on SCr of 1.56 mg/dL (H)).    Allergies  Allergen Reactions  . Atorvastatin Other (See Comments)    Joint swelling  . Duloxetine Hcl Rash  . Carbamazepine   . Lecithin   . Oxcarbazepine   . Tizanidine Hcl   . Yellow Dyes (Non-Tartrazine)     Yellow dye #5 specifically    Antimicrobials this admission: 5/25 Cefepime >>  5/25 Vancomycin >>   Dose adjustments this admission: N/A  Microbiology results: 5/25 BCx: pending 5/25 UCx: pending  5/25 MRSA PCR: pending 5/25 COVID-19 (-)  Thank you for allowing pharmacy to be a part of this patient's care.   Mauri Reading, PharmD Pharmacy Resident  04/25/2019 4:45 PM

## 2019-04-25 NOTE — H&P (Signed)
[employer] [facility] - [place] at [facility]    PATIENT NAME: Thomas Watkins    MR#:  [id number]  DATE OF BIRTH:  [birth year]  DATE OF ADMISSION:  04/25/2019  PRIMARY CARE PHYSICIAN: [facility]   REQUESTING/REFERRING PHYSICIAN: Dr. Minna Antis  CHIEF COMPLAINT:   Chief Complaint  Patient presents with   Fall  Altered mental status/hypotension.  HISTORY OF PRESENT ILLNESS:  Thomas Watkins  is a 73 y.o. male with a known history of obesity, dementia, diabetes, history of coronary disease status post bypass, obstructive sleep apnea, chronic diastolic dysfunction who presents to the hospital from a assisted living secondary to fall and noted to be significantly hypotensive with systolic blood pressures in the 80s.  Patient was also noted to have voluminous diarrhea in the ER and had a rectal tube placed.  Patient says that he started developing loose stools over the past 2 days which has been intermittently bloody in nature.  He admits to abdominal pain which is generalized but denies any fevers, chills, nausea or vomiting.  He also denies any chest pains, shortness of breath, cough congestion or any other associated symptoms.  When patient presented to the ER he was noted to be severely hypotensive and hypovolemic shock and received 2 L of IV fluids and continues to be hypotensive.  Patient's lactate level is significantly elevated.  Patient underwent a CT scan of his abdomen pelvis which was suggestive of colitis suspicious for possible infectious versus ischemic colitis.  Hospitalist services were contacted for admission.  PAST MEDICAL HISTORY:   Past Medical History:  Diagnosis Date   CAD (coronary artery disease)    a. CABG 2000; b. multiple stents; c. cardiac cath 02/05/2015: occluded mLAD, patent mid to distal LAD after the 100% occlusion with free LIMA to the mid LAD,  patent mid LAD stent w/ mild ISR, patent proximal to mid LAD stent before 100% occlusion, LCx & RCA  with mild diffuse disease. EF >55%. No intervention needed. Medical management. CP likely not cardiac.    CHF (congestive heart failure) (HCC)    Dementia (HCC)    Diabetes mellitus without complication (HCC)    Diastolic dysfunction    a. echo 01/2015: EF 60-65%, diastolic dysfunction, mild LVH, mildly dilated LA, mild AS (possibly calcified), mild TR,    HTN (hypertension)    OSA (obstructive sleep apnea)     PAST SURGICAL HISTORY:  History reviewed. No pertinent surgical history.  SOCIAL HISTORY:   Social History   Tobacco Use   Smoking status: Former Smoker  Substance Use Topics   Alcohol use: Never    Frequency: Never    FAMILY HISTORY:   Family History  Problem Relation Age of Onset   Heart disease Mother     DRUG ALLERGIES:   Allergies  Allergen Reactions   Atorvastatin Other (See Comments)    Joint swelling   Duloxetine Hcl Rash   Carbamazepine    Lecithin    Oxcarbazepine    Tizanidine Hcl    Yellow Dyes (Non-Tartrazine)     Yellow dye #5 specifically    REVIEW OF SYSTEMS:   Review of Systems  Constitutional: Negative for fever and weight loss.  HENT: Negative for congestion, nosebleeds and tinnitus.   Eyes: Negative for blurred vision, double vision and redness.  Respiratory: Negative for cough, hemoptysis and shortness of breath.   Cardiovascular: Negative for chest pain, orthopnea, leg swelling and PND.  Gastrointestinal: Positive for abdominal pain and diarrhea. Negative for  melena, nausea and vomiting.  Genitourinary: Negative for dysuria, hematuria and urgency.  Musculoskeletal: Positive for falls. Negative for joint pain.  Neurological: Positive for weakness (generalized. ). Negative for dizziness, tingling, sensory change, focal weakness, seizures and headaches.  Endo/Heme/Allergies: Negative for polydipsia. Does not bruise/bleed easily.  Psychiatric/Behavioral: Negative for depression and memory loss. The patient is not  nervous/anxious.     MEDICATIONS AT HOME:   Prior to Admission medications   Medication Sig Start Date End Date Taking? Authorizing Provider  acetaminophen (TYLENOL) 325 MG tablet Take 650 mg by mouth 3 (three) times daily.    Yes [provider]  acetaminophen (TYLENOL) 325 MG tablet Take 650 mg by mouth every 6 (six) hours as needed for mild pain or fever.   Yes [provider]  alum & mag hydroxide-simeth (MAALOX PLUS) 400-400-40 MG/5ML suspension Take 5 mLs by mouth every 6 (six) hours as needed for indigestion (take with lidocaine).   Yes [provider]  carbamide peroxide (DEBROX) 6.5 % OTIC solution Place 10 drops into both ears every Thursday.   Yes [provider]  cetirizine (ZYRTEC) 10 MG tablet Take 10 mg by mouth daily.   Yes [provider]  clopidogrel (PLAVIX) 75 MG tablet Take 75 mg by mouth daily.   Yes [provider]  Dextran 70-Hypromellose 0.1-0.3 % SOLN Place 1 drop into both eyes every 6 (six) hours as needed (dry eyes).   Yes [provider]  diclofenac sodium (VOLTAREN) 1 % GEL Place 2 g onto the skin every 8 (eight) hours as needed (pain).    Yes [provider]  docusate sodium (COLACE) 100 MG capsule Take 100 mg by mouth 2 (two) times daily.   Yes [provider]  DULoxetine (CYMBALTA) 60 MG capsule Take 60 mg by mouth daily.   Yes [provider]  famotidine (PEPCID) 20 MG tablet Take 20 mg by mouth daily.   Yes [provider]  ferrous sulfate 325 (65 FE) MG tablet Take 325 mg by mouth every Monday, Wednesday, and Friday.   Yes [provider]  fluticasone (FLONASE) 50 MCG/ACT nasal spray Place 1 spray into both nostrils daily.    Yes [provider]  gabapentin (NEURONTIN) 300 MG capsule Take 300 mg by mouth 3 (three) times daily.   Yes [provider]  hydrocortisone 2.5 % cream Apply 1 application topically every 12 (twelve) hours as  needed (itching).   Yes [provider]  insulin aspart (NOVOLOG) 100 UNIT/ML injection Inject 25 Units into the skin 3 (three) times daily before meals.   Yes [provider]  insulin glargine (LANTUS) 100 UNIT/ML injection Inject 50 Units into the skin 2 (two) times daily.   Yes [provider]  lidocaine (XYLOCAINE) 2 % solution Use as directed 5 mLs in the mouth or throat every 6 (six) hours as needed for mouth pain (take with Maalox).   Yes [provider]  lidocaine (XYLOCAINE) 5 % ointment Apply 1 application topically every 12 (twelve) hours as needed for mild pain or moderate pain.   Yes [provider]  lisinopril (ZESTRIL) 20 MG tablet Take 10 mg by mouth daily.    Yes [provider]  meclizine (ANTIVERT) 25 MG tablet Take 25 mg by mouth every 8 (eight) hours as needed for dizziness.   Yes [provider]  memantine (NAMENDA) 5 MG tablet Take 1 tablet (5 mg total) by mouth 2 (two) times daily. 05/23/16  Yes Gouru, Aruna, MD  metFORMIN (GLUCOPHAGE) 500 MG tablet Take 500 mg by mouth at bedtime.   Yes [provider]  metFORMIN (GLUCOPHAGE) 500 MG tablet Take 1,000 mg by mouth daily.   Yes [provider]  metoprolol succinate (TOPROL-XL) 50 MG 24 hr tablet Take 50 mg by mouth daily. Take with or immediately following a meal.   Yes [provider]  omeprazole (PRILOSEC) 40 MG capsule Take 40 mg by mouth 2 (two) times a day.   Yes [provider]  ranolazine (RANEXA) 500 MG 12 hr tablet Take 500 mg by mouth 2 (two) times daily.   Yes [provider]  rivaroxaban (XARELTO) 20 MG TABS tablet Take 20 mg by mouth daily with breakfast.    Yes [provider]  rosuvastatin (CRESTOR) 20 MG tablet Take 20 mg by mouth at bedtime.    Yes [provider]  sodium chloride (OCEAN) 0.65 % SOLN nasal spray Place 1 spray into both nostrils every 6 (six) hours as needed (dryness).   Yes  [provider]  traMADol (ULTRAM) 50 MG tablet Take 50 mg by mouth every 12 (twelve) hours as needed for moderate pain.   Yes [provider]  triamcinolone cream (KENALOG) 0.1 % Apply 1 application topically every 8 (eight) hours as needed (itching).   Yes [provider]  vitamin B-12 (CYANOCOBALAMIN) 100 MCG tablet Take 200 mcg by mouth daily.   Yes [provider]      VITAL SIGNS:  Blood pressure (!) 72/34, pulse 76, temperature 97.7 F (36.5 C), temperature source Rectal, resp. rate (!) 27, height 6' (1.829 m), weight (!) 137.9 kg, SpO2 98 %.  PHYSICAL EXAMINATION:  Physical Exam  GENERAL:  73 y.o.-year-old obese patient lying in the bed in no acute distress.  EYES: Pupils equal, round, reactive to light and accommodation. No scleral icterus. Extraocular muscles intact.  HEENT: Head atraumatic, normocephalic. Oropharynx and nasopharynx clear. No oropharyngeal erythema, moist oral mucosa  NECK:  Supple, no jugular venous distention. No thyroid enlargement, no tenderness.  LUNGS: Normal breath sounds bilaterally, no wheezing, rales, rhonchi. No use of accessory muscles of respiration.  CARDIOVASCULAR: S1, S2 RRR. No murmurs, rubs, gallops, clicks.  ABDOMEN: Soft, nontender, nondistended. Bowel sounds slightly hyperactive. No organomegaly or mass.  EXTREMITIES: No pedal edema, cyanosis, or clubbing. + 2 pedal & radial pulses b/l.   NEUROLOGIC: Cranial nerves II through XII are intact. No focal Motor or sensory deficits appreciated b/l. Globally weak.  PSYCHIATRIC: The patient is alert and oriented x 3.  SKIN: No obvious rash, lesion, or ulcer.   LABORATORY PANEL:   CBC Recent Labs  Lab 04/25/19 1502  WBC 17.0*  HGB 11.2*  HCT 40.4  PLT 408*   ------------------------------------------------------------------------------------------------------------------  Chemistries  Recent Labs  Lab 04/25/19 1502  NA 138  K 4.0  CL 103  CO2 17*   GLUCOSE 238*  BUN 18  CREATININE 1.56*  CALCIUM 8.9  AST 38  ALT 21  ALKPHOS 91  BILITOT 0.6   ------------------------------------------------------------------------------------------------------------------  Cardiac Enzymes Recent Labs  Lab 04/25/19 1502  TROPONINI <0.03   ------------------------------------------------------------------------------------------------------------------  RADIOLOGY:  Ct Abdomen Pelvis Wo Contrast  Result Date: 04/25/2019 CLINICAL DATA:  Hypotension, elevated WBC, diarrhea, abdominal pain EXAM: CT ABDOMEN AND PELVIS WITHOUT CONTRAST TECHNIQUE: Multidetector CT imaging of the abdomen and pelvis was performed following the standard protocol without IV contrast. COMPARISON:  None. FINDINGS: Lower chest: Cardiomegaly and coronary artery calcifications. Hepatobiliary: No focal  liver abnormality is seen. Hepatic steatosis. Gallstones. No gallbladder wall thickening, or biliary dilatation. Pancreas: Unremarkable. No pancreatic ductal dilatation or surrounding inflammatory changes. Spleen: Normal in size without focal abnormality. Adrenals/Urinary Tract: Adrenal glands are unremarkable. Kidneys are normal, without renal calculi, focal lesion, or hydronephrosis. Bladder is unremarkable. Stomach/Bowel: Stomach is within normal limits. Appendix appears normal. There is mild thickening of the descending colon, sigmoid colon, and rectum. The proximal colon is fluid-filled and there are dense stool balls in the rectum. Rectal tube is positioned in the rectum. Vascular/Lymphatic: Scattered calcific atherosclerosis. No enlarged abdominal or pelvic lymph nodes. Reproductive: No mass or other abnormality. Other: Small fat containing bilateral inguinal hernias. No abdominopelvic ascites. Musculoskeletal: No acute or significant osseous findings. IMPRESSION: 1. There is mild thickening of the descending colon, sigmoid colon, and rectum. This appearance suggests nonspecific  infectious, inflammatory, or ischemic colitis. The proximal colon is fluid-filled and there are dense stool balls in the rectum. Rectal tube is positioned in the rectum. 2.  Other chronic and incidental findings as detailed above. Electronically Signed   By: Lauralyn Primes M.D.   On: 04/25/2019 18:38   Ct Head Wo Contrast  Result Date: 04/25/2019 CLINICAL DATA:  Status post unwitnessed fall, hypotensive. EXAM: CT HEAD WITHOUT CONTRAST TECHNIQUE: Contiguous axial images were obtained from the base of the skull through the vertex without intravenous contrast. COMPARISON:  Head CT dated 09/30/2014. FINDINGS: Brain: Generalized age related parenchymal volume loss with commensurate dilatation of the ventricles and sulci. Mild chronic small vessel ischemic changes within the periventricular white matter regions bilaterally. Ventricles are stable in size. No mass, hemorrhage, edema or other evidence of acute parenchymal abnormality. No extra-axial hemorrhage. Vascular: Chronic calcified atherosclerotic changes of the large vessels at the skull base. No unexpected hyperdense vessel. Skull: Normal. Negative for fracture or focal lesion. Sinuses/Orbits: No acute finding. Other: None. IMPRESSION: 1. No acute findings. No intracranial mass, hemorrhage or edema. No skull fracture. 2. Atrophy and chronic ischemic changes in the white matter. Electronically Signed   By: Bary Richard M.D.   On: 04/25/2019 15:52   Dg Chest Portable 1 View  Result Date: 04/25/2019 CLINICAL DATA:  Chest pain after fall.  Diaphoresis. EXAM: PORTABLE CHEST 1 VIEW COMPARISON:  Radiographs 05/22/2016. FINDINGS: 1514 hours. There are slightly lower lung volumes. There is stable cardiomegaly status post median sternotomy. Aortic atherosclerosis and a spinal stimulator are noted. There is vascular congestion with mild bibasilar atelectasis. No edema, confluent airspace opacity, pleural effusion or pneumothorax. No acute osseous findings. Telemetry  leads overlie the chest. IMPRESSION: No acute posttraumatic findings identified. Cardiomegaly with vascular congestion and mild bibasilar atelectasis. Electronically Signed   By: Carey Bullocks M.D.   On: 04/25/2019 15:48     IMPRESSION AND PLAN:   73 y.o. male with a known history of obesity, dementia, diabetes, history of coronary disease status post bypass, obstructive sleep apnea, chronic diastolic dysfunction who presents to the hospital from a assisted living secondary to fall and noted to be significantly hypotensive with systolic blood pressures in the 80s.  1.  Shock- suspected to be either septic versus hypovolemic shock. - Patient having significant diarrhea and excessive volume loss. - Continue aggressive hydration with IV fluids, follow hemodynamics.  If needed will start on vasopressors.  Patient to be admitted to stepdown level of care. -In the ER patient given some broad-spectrum IV antibiotics with vancomycin, cefepime, Flagyl.  I will just narrow the antibiotics to Cipro Flagyl for now given patient's  CT abdomen pelvis findings.  2.  Acute diarrhea-etiology unclear presently. -Suspected source of patient's shock.  Patient stool for C. difficile is negative.  Await stool for comprehensive culture. - CT abdomen is suggestive of colitis questionable infectious versus inflammatory.  Empirically treat the patient with IV ciprofloxacin and Flagyl.  If needed will consider gastroenterology consult.  3.  Leukocytosis-secondary to acute diarrhea. -Follow-up with IV antibiotic therapy.  4.  Diabetes type 2 with neuropathy-hold patient's schedule insulin as patient to be n.p.o. for now. Hold Metformin for now.   -Will place on sliding scale insulin.  Follow blood sugars.  5.  Diabetic neuropathy- we will resume oral gabapentin when patient can take p.o.  6.  History of atrial fibrillation-rate controlled.  Hold patient's Toprol given the relative hypotension. -Also hold Xarelto  given the fact that patient's stools are heme positive for now.  7.  History of coronary disease-patient has no acute chest pain. - hold Plavix due to Heme + stools.    8. Hyperlipidemia - cont. Crestor.   9. Essential HTN -hold all patient's antihypertensives given the severe hypotension and shock. -Continue  Patient to be admitted to stepdown level of care and discussed with intensivist Dr. Jayme Cloud.  All the records are reviewed and case discussed with ED provider. Management plans discussed with the patient, family and they are in agreement.  CODE STATUS: DNR  TOTAL Critical Care TIME TAKING CARE OF THIS PATIENT: 50 minutes.    Houston Siren M.D on 04/25/2019 at 7:01 PM  Between 7am to 6pm - Pager - [phone]  After 6pm go to [email or web address] - password [email or web address] [facility]  [facility]  Office  [phone]  CC: Primary care physician; [facility]

## 2019-04-26 ENCOUNTER — Inpatient Hospital Stay: Payer: Medicare Other

## 2019-04-26 DIAGNOSIS — K559 Vascular disorder of intestine, unspecified: Secondary | ICD-10-CM

## 2019-04-26 LAB — CBC WITH DIFFERENTIAL/PLATELET
Abs Immature Granulocytes: 0.37 10*3/uL — ABNORMAL HIGH (ref 0.00–0.07)
Abs Immature Granulocytes: 0.21 10*3/uL — ABNORMAL HIGH (ref 0.00–0.07)
Basophils Absolute: 0.1 10*3/uL (ref 0.0–0.1)
Basophils Absolute: 0 10*3/uL (ref 0.0–0.1)
Basophils Relative: 0 %
Basophils Relative: 0 %
Eosinophils Absolute: 0.1 10*3/uL (ref 0.0–0.5)
Eosinophils Absolute: 0.2 10*3/uL (ref 0.0–0.5)
Eosinophils Relative: 0 %
Eosinophils Relative: 1 %
HCT: 37.3 % — ABNORMAL LOW (ref 39.0–52.0)
HCT: 30.9 % — ABNORMAL LOW (ref 39.0–52.0)
Hemoglobin: 10.3 g/dL — ABNORMAL LOW (ref 13.0–17.0)
Hemoglobin: 9 g/dL — ABNORMAL LOW (ref 13.0–17.0)
Immature Granulocytes: 1 %
Immature Granulocytes: 1 %
Lymphocytes Relative: 5 %
Lymphocytes Relative: 9 %
Lymphs Abs: 2.1 10*3/uL (ref 0.7–4.0)
Lymphs Abs: 1.9 10*3/uL (ref 0.7–4.0)
MCH: 21.3 pg — ABNORMAL LOW (ref 26.0–34.0)
MCH: 21.8 pg — ABNORMAL LOW (ref 26.0–34.0)
MCHC: 27.6 g/dL — ABNORMAL LOW (ref 30.0–36.0)
MCHC: 29.1 g/dL — ABNORMAL LOW (ref 30.0–36.0)
MCV: 77.2 fL — ABNORMAL LOW (ref 80.0–100.0)
MCV: 75 fL — ABNORMAL LOW (ref 80.0–100.0)
Monocytes Absolute: 3.4 10*3/uL — ABNORMAL HIGH (ref 0.1–1.0)
Monocytes Absolute: 1.6 10*3/uL — ABNORMAL HIGH (ref 0.1–1.0)
Monocytes Relative: 9 %
Monocytes Relative: 7 %
Neutro Abs: 33.1 10*3/uL — ABNORMAL HIGH (ref 1.7–7.7)
Neutro Abs: 18 10*3/uL — ABNORMAL HIGH (ref 1.7–7.7)
Neutrophils Relative %: 85 %
Neutrophils Relative %: 82 %
Platelets: 319 10*3/uL (ref 150–400)
Platelets: 246 10*3/uL (ref 150–400)
RBC: 4.83 MIL/uL (ref 4.22–5.81)
RBC: 4.12 MIL/uL — ABNORMAL LOW (ref 4.22–5.81)
RDW: 19.1 % — ABNORMAL HIGH (ref 11.5–15.5)
RDW: 18.9 % — ABNORMAL HIGH (ref 11.5–15.5)
Smear Review: NORMAL
Smear Review: NORMAL
WBC Morphology: INCREASED
WBC: 39.2 10*3/uL — ABNORMAL HIGH (ref 4.0–10.5)
WBC: 22 10*3/uL — ABNORMAL HIGH (ref 4.0–10.5)
nRBC: 0 % (ref 0.0–0.2)
nRBC: 0 % (ref 0.0–0.2)

## 2019-04-26 LAB — COMPREHENSIVE METABOLIC PANEL
ALT: 21 U/L (ref 0–44)
AST: 40 U/L (ref 15–41)
Albumin: 3.2 g/dL — ABNORMAL LOW (ref 3.5–5.0)
Alkaline Phosphatase: 74 U/L (ref 38–126)
Anion gap: 18 — ABNORMAL HIGH (ref 5–15)
BUN: 23 mg/dL (ref 8–23)
CO2: 15 mmol/L — ABNORMAL LOW (ref 22–32)
Calcium: 8.2 mg/dL — ABNORMAL LOW (ref 8.9–10.3)
Chloride: 107 mmol/L (ref 98–111)
Creatinine, Ser: 2.36 mg/dL — ABNORMAL HIGH (ref 0.61–1.24)
GFR calc Af Amer: 30 mL/min — ABNORMAL LOW (ref >60)
GFR calc non Af Amer: 26 mL/min — ABNORMAL LOW (ref >60)
Glucose, Bld: 145 mg/dL — ABNORMAL HIGH (ref 70–99)
Potassium: 6.3 mmol/L (ref 3.5–5.1)
Sodium: 140 mmol/L (ref 135–145)
Total Bilirubin: 0.6 mg/dL (ref 0.3–1.2)
Total Protein: 6.3 g/dL — ABNORMAL LOW (ref 6.5–8.1)

## 2019-04-26 LAB — BASIC METABOLIC PANEL
Anion gap: 17 — ABNORMAL HIGH (ref 5–15)
Anion gap: 13 (ref 5–15)
BUN: 26 mg/dL — ABNORMAL HIGH (ref 8–23)
BUN: 31 mg/dL — ABNORMAL HIGH (ref 8–23)
CO2: 15 mmol/L — ABNORMAL LOW (ref 22–32)
CO2: 20 mmol/L — ABNORMAL LOW (ref 22–32)
Calcium: 7.7 mg/dL — ABNORMAL LOW (ref 8.9–10.3)
Calcium: 7.4 mg/dL — ABNORMAL LOW (ref 8.9–10.3)
Chloride: 106 mmol/L (ref 98–111)
Chloride: 102 mmol/L (ref 98–111)
Creatinine, Ser: 2.69 mg/dL — ABNORMAL HIGH (ref 0.61–1.24)
Creatinine, Ser: 2.5 mg/dL — ABNORMAL HIGH (ref 0.61–1.24)
GFR calc Af Amer: 26 mL/min — ABNORMAL LOW (ref >60)
GFR calc Af Amer: 28 mL/min — ABNORMAL LOW (ref >60)
GFR calc non Af Amer: 22 mL/min — ABNORMAL LOW (ref >60)
GFR calc non Af Amer: 24 mL/min — ABNORMAL LOW (ref >60)
Glucose, Bld: 168 mg/dL — ABNORMAL HIGH (ref 70–99)
Glucose, Bld: 159 mg/dL — ABNORMAL HIGH (ref 70–99)
Potassium: 6.2 mmol/L — ABNORMAL HIGH (ref 3.5–5.1)
Potassium: 4.5 mmol/L (ref 3.5–5.1)
Sodium: 138 mmol/L (ref 135–145)
Sodium: 135 mmol/L (ref 135–145)

## 2019-04-26 LAB — MAGNESIUM: Magnesium: 2.3 mg/dL (ref 1.7–2.4)

## 2019-04-26 LAB — LACTIC ACID, PLASMA
Lactic Acid, Venous: 8.9 mmol/L (ref 0.5–1.9)
Lactic Acid, Venous: 4.5 mmol/L (ref 0.5–1.9)

## 2019-04-26 LAB — GLUCOSE, CAPILLARY
Glucose-Capillary: 121 mg/dL — ABNORMAL HIGH (ref 70–99)
Glucose-Capillary: 160 mg/dL — ABNORMAL HIGH (ref 70–99)
Glucose-Capillary: 183 mg/dL — ABNORMAL HIGH (ref 70–99)
Glucose-Capillary: 156 mg/dL — ABNORMAL HIGH (ref 70–99)
Glucose-Capillary: 170 mg/dL — ABNORMAL HIGH (ref 70–99)
Glucose-Capillary: 171 mg/dL — ABNORMAL HIGH (ref 70–99)

## 2019-04-26 LAB — CBC
HCT: 32.7 % — ABNORMAL LOW (ref 39.0–52.0)
Hemoglobin: 9.5 g/dL — ABNORMAL LOW (ref 13.0–17.0)
MCH: 21.6 pg — ABNORMAL LOW (ref 26.0–34.0)
MCHC: 29.1 g/dL — ABNORMAL LOW (ref 30.0–36.0)
MCV: 74.3 fL — ABNORMAL LOW (ref 80.0–100.0)
Platelets: 243 10*3/uL (ref 150–400)
RBC: 4.4 MIL/uL (ref 4.22–5.81)
RDW: 19 % — ABNORMAL HIGH (ref 11.5–15.5)
WBC: 18.7 10*3/uL — ABNORMAL HIGH (ref 4.0–10.5)
nRBC: 0 % (ref 0.0–0.2)

## 2019-04-26 LAB — IRON AND TIBC
Iron: 20 ug/dL — ABNORMAL LOW (ref 45–182)
Saturation Ratios: 5 % — ABNORMAL LOW (ref 17.9–39.5)
TIBC: 396 ug/dL (ref 250–450)
UIBC: 376 ug/dL

## 2019-04-26 LAB — VANCOMYCIN, RANDOM: Vancomycin Rm: 8

## 2019-04-26 LAB — TYPE AND SCREEN
ABO/RH(D): A POS
Antibody Screen: NEGATIVE

## 2019-04-26 LAB — PATHOLOGIST SMEAR REVIEW

## 2019-04-26 LAB — PROTIME-INR
INR: 1.4 — ABNORMAL HIGH (ref 0.8–1.2)
Prothrombin Time: 17.3 seconds — ABNORMAL HIGH (ref 11.4–15.2)

## 2019-04-26 LAB — BLOOD GAS, ARTERIAL
Acid-base deficit: 13.4 mmol/L — ABNORMAL HIGH (ref 0.0–2.0)
Allens test (pass/fail): POSITIVE — AB
Bicarbonate: 12.1 mmol/L — ABNORMAL LOW (ref 20.0–28.0)
FIO2: 0.32
O2 Saturation: 95.2 %
Patient temperature: 37
pCO2 arterial: 27 mmHg — ABNORMAL LOW (ref 32.0–48.0)
pH, Arterial: 7.26 — ABNORMAL LOW (ref 7.350–7.450)
pO2, Arterial: 88 mmHg (ref 83.0–108.0)

## 2019-04-26 LAB — FERRITIN: Ferritin: 19 ng/mL — ABNORMAL LOW (ref 24–336)

## 2019-04-26 LAB — PROCALCITONIN: Procalcitonin: 40.92 ng/mL

## 2019-04-26 LAB — FOLATE: Folate: 14.6 ng/mL (ref >5.9)

## 2019-04-26 LAB — VITAMIN B12: Vitamin B-12: 537 pg/mL (ref 180–914)

## 2019-04-26 MED ORDER — INSULIN ASPART 100 UNIT/ML IV SOLN
10.0000 [IU] | Freq: Once | INTRAVENOUS | Status: AC
  Administered 2019-04-26: 10 [IU] via INTRAVENOUS
  Filled 2019-04-26: qty 0.1

## 2019-04-26 MED ORDER — SODIUM BICARBONATE 8.4 % IV SOLN
INTRAVENOUS | Status: DC
  Administered 2019-04-26 – 2019-04-27 (×2): via INTRAVENOUS
  Filled 2019-04-26 (×5): qty 150

## 2019-04-26 MED ORDER — VANCOMYCIN VARIABLE DOSE PER UNSTABLE RENAL FUNCTION (PHARMACIST DOSING): Status: DC

## 2019-04-26 MED ORDER — PATIROMER SORBITEX CALCIUM 8.4 G PO PACK
8.4000 g | PACK | Freq: Once | ORAL | Status: AC
  Administered 2019-04-26: 8.4 g via ORAL
  Filled 2019-04-26: qty 1

## 2019-04-26 MED ORDER — SODIUM BICARBONATE 8.4 % IV SOLN
50.0000 meq | Freq: Once | INTRAVENOUS | Status: AC
  Administered 2019-04-26: 50 meq via INTRAVENOUS
  Filled 2019-04-26: qty 50

## 2019-04-26 MED ORDER — IOHEXOL 240 MG/ML SOLN
50.0000 mL | INTRAMUSCULAR | Status: AC
  Administered 2019-04-26 (×2): 50 mL

## 2019-04-26 MED ORDER — INSULIN ASPART 100 UNIT/ML ~~LOC~~ SOLN
0.0000 [IU] | SUBCUTANEOUS | Status: DC
  Administered 2019-04-26 – 2019-04-27 (×7): 2 [IU] via SUBCUTANEOUS
  Administered 2019-04-27 (×3): 1 [IU] via SUBCUTANEOUS
  Administered 2019-04-27: 2 [IU] via SUBCUTANEOUS
  Administered 2019-04-28: 1 [IU] via SUBCUTANEOUS
  Administered 2019-04-28: 04:00:00 2 [IU] via SUBCUTANEOUS
  Administered 2019-04-28 (×3): 1 [IU] via SUBCUTANEOUS
  Administered 2019-04-29: 2 [IU] via SUBCUTANEOUS
  Filled 2019-04-26 (×17): qty 1

## 2019-04-26 MED ORDER — VANCOMYCIN HCL 1.5 G IV SOLR
1500.0000 mg | Freq: Once | INTRAVENOUS | Status: AC
  Administered 2019-04-26: 21:00:00 1500 mg via INTRAVENOUS
  Filled 2019-04-26: qty 1500

## 2019-04-26 MED ORDER — LACTATED RINGERS IV BOLUS
1000.0000 mL | Freq: Once | INTRAVENOUS | Status: AC
  Administered 2019-04-26: 1000 mL via INTRAVENOUS

## 2019-04-26 MED ORDER — DEXTROSE 50 % IV SOLN
1.0000 | Freq: Once | INTRAVENOUS | Status: AC
  Administered 2019-04-26: 50 mL via INTRAVENOUS
  Filled 2019-04-26: qty 50

## 2019-04-26 NOTE — Progress Notes (Signed)
CRITICAL CARE NOTE        SUBJECTIVE FINDINGS & SIGNIFICANT EVENTS   Patient remains critically ill Prognosis is guarded  Hyperkalemia - initiating Veltasa Flagyl,Cipro, will consider d/c vancomycin. -Levophed off this am   PAST MEDICAL HISTORY   Past Medical History:  Diagnosis Date   CAD (coronary artery disease)    a. CABG 2000; b. multiple stents; c. cardiac cath 02/05/2015: occluded mLAD, patent mid to distal LAD after the 100% occlusion with free LIMA to the mid LAD,  patent mid LAD stent w/ mild ISR, patent proximal to mid LAD stent before 100% occlusion, LCx & RCA with mild diffuse disease. EF >55%. No intervention needed. Medical management. CP likely not cardiac.    CHF (congestive heart failure) (HCC)    Dementia (HCC)    Diabetes mellitus without complication (HCC)    Diastolic dysfunction    a. echo 01/2015: EF 60-65%, diastolic dysfunction, mild LVH, mildly dilated LA, mild AS (possibly calcified), mild TR,    HTN (hypertension)    OSA (obstructive sleep apnea)      SURGICAL HISTORY   History reviewed. No pertinent surgical history.   FAMILY HISTORY   Family History  Problem Relation Age of Onset   Heart disease Mother      SOCIAL HISTORY   Social History   Tobacco Use   Smoking status: Former Smoker  Substance Use Topics   Alcohol use: Never    Frequency: Never   Drug use: Never     MEDICATIONS   Current Medication:  Current Facility-Administered Medications:    acetaminophen (TYLENOL) tablet 650 mg, 650 mg, Oral, Q6H PRN **OR** acetaminophen (TYLENOL) suppository 650 mg, 650 mg, Rectal, Q6H PRN, Sainani, Rolly PancakeVivek J, MD   Chlorhexidine Gluconate Cloth 2 % PADS 6 each, 6 each, Topical, Daily, Eugenie NorrieBlakeney, Dana G, NP, 6 each at 04/25/19 2100   ciprofloxacin  (CIPRO) IVPB 400 mg, 400 mg, Intravenous, Q12H, [place] SirenSainani, Vivek J, MD, Last Rate: 200 mL/hr at 04/26/19 0955, 400 mg at 04/26/19 0955   insulin aspart (novoLOG) injection 0-9 Units, 0-9 Units, Subcutaneous, Q4H, Eugenie NorrieBlakeney, Dana G, NP, 2 Units at 04/26/19 0843   metroNIDAZOLE (FLAGYL) IVPB 500 mg, 500 mg, Intravenous, Q8H, Sainani, Rolly PancakeVivek J, MD, Last Rate: 100 mL/hr at 04/26/19 0846, 500 mg at 04/26/19 0846   morphine 2 MG/ML injection 1-2 mg, 1-2 mg, Intravenous, Q4H PRN, Eugenie NorrieBlakeney, Dana G, NP, 2 mg at 04/26/19 0204   mupirocin ointment (BACTROBAN) 2 % 1 application, 1 application, Nasal, BID, Eugenie NorrieBlakeney, Dana G, NP, 1 application at 04/26/19 1002   norepinephrine (LEVOPHED) 4mg  in 250mL premix infusion, 0-40 mcg/min, Intravenous, Continuous, Sainani, Rolly PancakeVivek J, MD, Stopped at 04/26/19 0731   ondansetron (ZOFRAN) tablet 4 mg, 4 mg, Oral, Q6H PRN **OR** ondansetron (ZOFRAN) injection 4 mg, 4 mg, Intravenous, Q6H PRN, Cherlynn KaiserSainani, Vivek J, MD, 4 mg at 04/25/19 2200   pantoprazole (PROTONIX) injection 40 mg, 40 mg, Intravenous, Q12H, Blakeney, Neldon Newportana G, NP, 40 mg at 04/26/19 0955   sodium bicarbonate 150 mEq in dextrose 5 % 1,000 mL infusion, , Intravenous, Continuous, Eugenie NorrieBlakeney, Dana G, NP, Last Rate: 100 mL/hr at 04/26/19 1100   vancomycin variable dose per unstable renal function (pharmacist dosing), , Does not apply, See admin instructions, Delfino LovettShah, Vipul, MD    ALLERGIES   Atorvastatin; Duloxetine hcl; Carbamazepine; Lecithin; Oxcarbazepine; Tizanidine hcl; and Yellow dyes (non-tartrazine)    REVIEW OF SYSTEMS     10 point ROS conducted and is negative except overall malaise  PHYSICAL EXAMINATION   Vitals:   04/26/19 1000 04/26/19 1030  BP: 98/74 124/68  Pulse: (!) 47 87  Resp: (!) 21 (!) 23  Temp:    SpO2: 97% 100%    GENERAL: Patient in fetal position reports abdominal discomfort HEAD: Normocephalic, atraumatic.  EYES: Pupils equal, round, reactive to light.  No scleral icterus.   MOUTH: Moist mucosal membrane. NECK: Supple. No thyromegaly. No nodules. No JVD.  PULMONARY: Decreased breath sounds bilaterally without wheezing CARDIOVASCULAR: S1 and S2. Regular rate and rhythm. No murmurs, rubs, or gallops.  GASTROINTESTINAL: Soft, nontender, non-distended. No masses. Positive bowel sounds. No hepatosplenomegaly.  MUSCULOSKELETAL: No swelling, clubbing, or edema.  NEUROLOGIC: Mild distress due to acute illness SKIN:intact,warm,dry   LABS AND IMAGING     LAB RESULTS: Recent Labs  Lab 04/25/19 1502 04/26/19 0131 04/26/19 0802  NA 138 140 138  K 4.0 6.3* 6.2*  CL 103 107 106  CO2 17* 15* 15*  BUN 18 23 26*  CREATININE 1.56* 2.36* 2.69*  GLUCOSE 238* 145* 168*   Recent Labs  Lab 04/25/19 1502 04/26/19 0131  HGB 11.2* 10.3*  HCT 40.4 37.3*  WBC 17.0* 39.2*  PLT 408* 319     IMAGING RESULTS: Ct Abdomen Pelvis Wo Contrast  Result Date: 04/26/2019 CLINICAL DATA:  Abdominal distension EXAM: CT ABDOMEN AND PELVIS WITHOUT CONTRAST TECHNIQUE: Multidetector CT imaging of the abdomen and pelvis was performed following the standard protocol without IV contrast. COMPARISON:  04/25/2019 FINDINGS: Lower chest: Subsegmental atelectasis. Hepatobiliary: Small gallstone towards the neck of the gallbladder is present. Unremarkable liver. Pancreas: Atrophic.  No mass. Spleen: Unremarkable Adrenals/Urinary Tract: Exophytic small cyst from the posterior left kidney is stable. Adrenal glands are unremarkable. Right kidney is within normal limits. Bladder is decompressed by a Foley catheter. Stomach/Bowel: Stomach is distended with oral contrast. New NG tube is in place with its tip in the body of the stomach. Minimally distended small bowel loops in the lower central abdomen are noted. No clear transition point is identified. Remainder of small bowel and colon are decompressed. There is continued wall thickening of the descending and sigmoid colon with inflammatory changes in  the adjacent fat. Stranding in the adjacent fat has become more pronounced. There is no obvious pneumatosis. There is no free intraperitoneal gas. No evidence of abscess formation. There is also stranding surrounding the rectum. Rectal tube has been removed. Vascular/Lymphatic: Atherosclerotic calcifications of the aorta and iliac arteries are mild. There is no abnormal retroperitoneal adenopathy by measurement criteria. Reproductive: Normal prostate. Other: No free fluid.  Small inguinal hernias contain adipose. Musculoskeletal: Lumbosacral fusion hardware is in place with pedicle screws and stabilizing bars from L2 through S1. Neural stimulator device traverses into the posterior central spinal canal at T12-L1. The tip is at T8 and T9. No vertebral compression deformity. IMPRESSION: Inflammatory changes involving the descending colon, sigmoid colon, and rectum have become more pronounced. There is no evidence of perforation or abscess. Differential diagnosis includes inflammatory change, infection, inflammatory bowel disease, pseudomembranous colitis, and ischemia are not excluded. NG tube tip in the stomach.  Rectal tube removed. Mild small bowel ileus pattern. Electronically Signed   By: Jolaine Click M.D.   On: 04/26/2019 07:40   Ct Abdomen Pelvis Wo Contrast  Result Date: 04/25/2019 CLINICAL DATA:  Hypotension, elevated WBC, diarrhea, abdominal pain EXAM: CT ABDOMEN AND PELVIS WITHOUT CONTRAST TECHNIQUE: Multidetector CT imaging of the abdomen and pelvis was performed following the standard protocol without IV contrast. COMPARISON:  None.  FINDINGS: Lower chest: Cardiomegaly and coronary artery calcifications. Hepatobiliary: No focal liver abnormality is seen. Hepatic steatosis. Gallstones. No gallbladder wall thickening, or biliary dilatation. Pancreas: Unremarkable. No pancreatic ductal dilatation or surrounding inflammatory changes. Spleen: Normal in size without focal abnormality. Adrenals/Urinary Tract:  Adrenal glands are unremarkable. Kidneys are normal, without renal calculi, focal lesion, or hydronephrosis. Bladder is unremarkable. Stomach/Bowel: Stomach is within normal limits. Appendix appears normal. There is mild thickening of the descending colon, sigmoid colon, and rectum. The proximal colon is fluid-filled and there are dense stool balls in the rectum. Rectal tube is positioned in the rectum. Vascular/Lymphatic: Scattered calcific atherosclerosis. No enlarged abdominal or pelvic lymph nodes. Reproductive: No mass or other abnormality. Other: Small fat containing bilateral inguinal hernias. No abdominopelvic ascites. Musculoskeletal: No acute or significant osseous findings. IMPRESSION: 1. There is mild thickening of the descending colon, sigmoid colon, and rectum. This appearance suggests nonspecific infectious, inflammatory, or ischemic colitis. The proximal colon is fluid-filled and there are dense stool balls in the rectum. Rectal tube is positioned in the rectum. 2.  Other chronic and incidental findings as detailed above. Electronically Signed   By: Lauralyn Primes M.D.   On: 04/25/2019 18:38   Dg Abd 1 View  Result Date: 04/26/2019 CLINICAL DATA:  NG tube placement EXAM: ABDOMEN - 1 VIEW COMPARISON:  CT from the same day FINDINGS: The tip of the NG tube projects over the gastric body. The bowel gas pattern is nonspecific. IMPRESSION: NG tube projects over the gastric body. Electronically Signed   By: Katherine Mantle M.D.   On: 04/26/2019 03:32   Ct Head Wo Contrast  Result Date: 04/25/2019 CLINICAL DATA:  Status post unwitnessed fall, hypotensive. EXAM: CT HEAD WITHOUT CONTRAST TECHNIQUE: Contiguous axial images were obtained from the base of the skull through the vertex without intravenous contrast. COMPARISON:  Head CT dated 09/30/2014. FINDINGS: Brain: Generalized age related parenchymal volume loss with commensurate dilatation of the ventricles and sulci. Mild chronic small vessel  ischemic changes within the periventricular white matter regions bilaterally. Ventricles are stable in size. No mass, hemorrhage, edema or other evidence of acute parenchymal abnormality. No extra-axial hemorrhage. Vascular: Chronic calcified atherosclerotic changes of the large vessels at the skull base. No unexpected hyperdense vessel. Skull: Normal. Negative for fracture or focal lesion. Sinuses/Orbits: No acute finding. Other: None. IMPRESSION: 1. No acute findings. No intracranial mass, hemorrhage or edema. No skull fracture. 2. Atrophy and chronic ischemic changes in the white matter. Electronically Signed   By: Bary Richard M.D.   On: 04/25/2019 15:52   Dg Chest Portable 1 View  Result Date: 04/25/2019 CLINICAL DATA:  Chest pain after fall.  Diaphoresis. EXAM: PORTABLE CHEST 1 VIEW COMPARISON:  Radiographs 05/22/2016. FINDINGS: 1514 hours. There are slightly lower lung volumes. There is stable cardiomegaly status post median sternotomy. Aortic atherosclerosis and a spinal stimulator are noted. There is vascular congestion with mild bibasilar atelectasis. No edema, confluent airspace opacity, pleural effusion or pneumothorax. No acute osseous findings. Telemetry leads overlie the chest. IMPRESSION: No acute posttraumatic findings identified. Cardiomegaly with vascular congestion and mild bibasilar atelectasis. Electronically Signed   By: Carey Bullocks M.D.   On: 04/25/2019 15:48      ASSESSMENT AND PLAN    -Multidisciplinary rounds held today    Septic shock -due to colitis On empiric abx  -no off vasopressors  -follow up cultures -emperic ABX -consider stress dose steroids -GI on case - discussed case with Dr Tobi Bastos    Acute  blood loss anemia -due to rectal bleeding   -CT chest with bowel wall thickening   - GI on case - discussed with Dr Tobi Bastos appreciate input   - probable ischemic colitis  -transfuse as needed  - possible UC exacerbation - may need flex  sigmoidoscopy    Acute kidney Injury stage 2     Nephrology on case - appreciate input - Acute renal failure with hyperkalemia and metabolic acidosis. Baseline creatinine of 1 from 2015.  Acute renal failure from current illness. Lactic acidosis is concerning.  - holding lisinopril - Agree with sodium bicarbonate infusion - Nonoliguric: monitor urine output and volume status. - Due to acidosis: will consider dialysis if no improvement.  - Check metformin level.  -follow chem 7 -follow UO -continue Foley Catheter-assess need daily   ID -continue IV abx as prescibed -follow up cultures  GI/Nutrition GI PROPHYLAXIS as indicated DIET-->TF's as tolerated Constipation protocol as indicated  ENDO - ICU hypoglycemic\Hyperglycemia protocol -check FSBS per protocol   ELECTROLYTES -follow labs as needed -replace as needed -pharmacy consultation   DVT/GI PRX ordered -SCDs  TRANSFUSIONS AS NEEDED MONITOR FSBS ASSESS the need for LABS as needed   Critical care provider statement:    Critical care time (minutes):  35   Critical care time was exclusive of:  Separately billable procedures and treating other patients   Critical care was necessary to treat or prevent imminent or life-threatening deterioration of the following conditions:   Acute blood loss anemia due to GI bleed, septic shock, ischemic colitis, ulcerative colitis, acute kidney injury, possible metformin poisoning   Critical care was time spent personally by me on the following activities:  Development of treatment plan with patient or surrogate, discussions with consultants, evaluation of patient's response to treatment, examination of patient, obtaining history from patient or surrogate, ordering and performing treatments and interventions, ordering and review of laboratory studies and re-evaluation of patient's condition.  I assumed direction of critical care for this patient from another provider in my specialty: no     This document was prepared using Dragon voice recognition software and may include unintentional dictation errors.    Vida Rigger, M.D.  Division of Pulmonary & Critical Care Medicine  [facility]

## 2019-04-26 NOTE — Progress Notes (Signed)
Pt recevied From Ed to Rm 17 via stretcher. A x O x 3  On Lephed gtt, Lactate ringer.  VS stable @ this time . See flowsheet for detailed charting. Will continue to monitor pt closely.

## 2019-04-26 NOTE — Consult Note (Signed)
Pharmacy Antibiotic Note  Thomas Watkins is a 73 y.o. male admitted on 04/25/2019 with sepsis.  Pharmacy has been consulted for vancomycin dosing. Pt is on metronidazole and ciprofloxacin.   Plan: Pt received a vancomycin loading dose of 2500 mg x 1 on 5/25 @1849 . Creatinine is trending up 1.56 to 2.36. Will dose by levels, renal function is unstable.   5/26 - 24 hour level is 8 mcg/ml. Creatinine is continuing to trend upward. Will order Vancomycin 1500mg  IV once and check random level in 24 hours.   Height: 6' (182.9 cm) Weight: (!) 300 lb 14.9 oz (136.5 kg) IBW/kg (Calculated) : 77.6  Temp (24hrs), Avg:97.8 F (36.6 C), Min:97.5 F (36.4 C), Max:98.3 F (36.8 C)  Recent Labs  Lab 04/25/19 1502 04/25/19 1730 04/25/19 2107 04/26/19 0131 04/26/19 0802 04/26/19 1256 04/26/19 1641 04/26/19 1655  WBC 17.0*  --   --  39.2*  --  22.0* 18.7*  --   CREATININE 1.56*  --   --  2.36* 2.69*  --   --  2.50*  LATICACIDVEN  --  10.2* 9.5* 8.9*  --   --  4.5*  --   VANCORANDOM  --   --   --   --   --   --  8  --     Estimated Creatinine Clearance: 36.5 mL/min (A) (by C-G formula based on SCr of 2.5 mg/dL (H)).    Allergies  Allergen Reactions  . Atorvastatin Other (See Comments)    Joint swelling  . Duloxetine Hcl Rash  . Carbamazepine   . Lecithin   . Oxcarbazepine   . Tizanidine Hcl   . Yellow Dyes (Non-Tartrazine)     Yellow dye #5 specifically    Antimicrobials this admission: 5/26 vancomycin >>  5/25 cipro >>  5/25 metronidazole >>  Dose adjustments this admission: None  Microbiology results: 5/25 BCx: pending 5/25 UCx: pending  5/25 MRSA PCR: positive 5/25 COVID-19 (-)  Thank you for allowing pharmacy to be a part of this patient's care.  Clovia Cuff, PharmD, BCPS 04/26/2019 6:10 PM

## 2019-04-26 NOTE — Consult Note (Addendum)
[facility]  CONSULT NOTE    Date: 04/26/2019                  Patient Name:  Thomas Watkins  MRN: [id number]  DOB: March 11, 1946  Age / Sex: 73 y.o., male         [facility]P: [facility]                 Service Requesting Consult: Dr. Cherlynn Kaiser                 Reason for Consult: Acute renal failure            History of Present Illness: Thomas Watkins is a 73 y.o. white male with hypertension, congestive heart failure, dementia, obesity, coronary artery disease, obstructive sleep apnea, who was admitted to [facility] on 04/25/2019 for Fall, initial encounter [W19.XXXA] Hypotension, unspecified hypotension type [I95.9] Diarrhea, unspecified type [R19.7]  Patient diarrhea and now with bright red blood per rectum. Placed on norepinephrine overnight.   Nephrology consulted for acute renal failure with hyperkalemia and metabolic acidosis. Started on a bicarb gtt.   Vomiting this morning.    Medications: Outpatient medications: Medications Prior to Admission  Medication Sig Dispense Refill Last Dose  . acetaminophen (TYLENOL) 325 MG tablet Take 650 mg by mouth 3 (three) times daily.    04/25/2019 at 1400  . acetaminophen (TYLENOL) 325 MG tablet Take 650 mg by mouth every 6 (six) hours as needed for mild pain or fever.   Unknown at PRN  . alum & mag hydroxide-simeth (MAALOX PLUS) 400-400-40 MG/5ML suspension Take 5 mLs by mouth every 6 (six) hours as needed for indigestion (take with lidocaine).   Unknown at PRN  . carbamide peroxide (DEBROX) 6.5 % OTIC solution Place 10 drops into both ears every Thursday.   04/21/2019 at 0800  . cetirizine (ZYRTEC) 10 MG tablet Take 10 mg by mouth daily.   04/25/2019 at 0800  . clopidogrel (PLAVIX) 75 MG tablet Take 75 mg by mouth daily.   04/25/2019 at 0800  . Dextran 70-Hypromellose 0.1-0.3 % SOLN Place 1 drop into both eyes every 6 (six) hours as needed (dry eyes).   Unknown at PRN  . diclofenac sodium (VOLTAREN) 1 %  GEL Place 2 g onto the skin every 8 (eight) hours as needed (pain).    Unknown at PRN  . docusate sodium (COLACE) 100 MG capsule Take 100 mg by mouth 2 (two) times daily.   04/25/2019 at 0800  . DULoxetine (CYMBALTA) 60 MG capsule Take 60 mg by mouth daily.   04/25/2019 at 0800  . famotidine (PEPCID) 20 MG tablet Take 20 mg by mouth daily.   04/25/2019 at 0800  . ferrous sulfate 325 (65 FE) MG tablet Take 325 mg by mouth every Monday, Wednesday, and Friday.   04/25/2019 at 0800  . fluticasone (FLONASE) 50 MCG/ACT nasal spray Place 1 spray into both nostrils daily.    04/25/2019 at 0800  . gabapentin (NEURONTIN) 300 MG capsule Take 300 mg by mouth 3 (three) times daily.   04/23/2019 at 0600  . hydrocortisone 2.5 % cream Apply 1 application topically every 12 (twelve) hours as needed (itching).   Unknown at PRN  . insulin aspart (NOVOLOG) 100 UNIT/ML injection Inject 25 Units into the skin 3 (three) times daily before meals.   04/25/2019 at 0800  . insulin glargine (LANTUS) 100 UNIT/ML injection Inject 50 Units into the skin 2 (two) times daily.  04/25/2019 at 0600  . lidocaine (XYLOCAINE) 2 % solution Use as directed 5 mLs in the mouth or throat every 6 (six) hours as needed for mouth pain (take with Maalox).   Unknown at PRN  . lidocaine (XYLOCAINE) 5 % ointment Apply 1 application topically every 12 (twelve) hours as needed for mild pain or moderate pain.   Unknown at PRN  . lisinopril (ZESTRIL) 20 MG tablet Take 10 mg by mouth daily.    04/25/2019 at 0800  . meclizine (ANTIVERT) 25 MG tablet Take 25 mg by mouth every 8 (eight) hours as needed for dizziness.   Unknown at PRN  . memantine (NAMENDA) 5 MG tablet Take 1 tablet (5 mg total) by mouth 2 (two) times daily. 60 tablet 0 04/25/2019 at 0800  . metFORMIN (GLUCOPHAGE) 500 MG tablet Take 500 mg by mouth at bedtime.   04/24/2019 at 2000  . metFORMIN (GLUCOPHAGE) 500 MG tablet Take 1,000 mg by mouth daily.   04/25/2019 at 0800  . metoprolol succinate  (TOPROL-XL) 50 MG 24 hr tablet Take 50 mg by mouth daily. Take with or immediately following a meal.   04/25/2019 at 0800  . omeprazole (PRILOSEC) 40 MG capsule Take 40 mg by mouth 2 (two) times a day.   04/25/2019 at 0600  . ranolazine (RANEXA) 500 MG 12 hr tablet Take 500 mg by mouth 2 (two) times daily.   04/25/2019 at 0800  . rivaroxaban (XARELTO) 20 MG TABS tablet Take 20 mg by mouth daily with breakfast.    04/25/2019 at 0800  . rosuvastatin (CRESTOR) 20 MG tablet Take 20 mg by mouth at bedtime.    04/24/2019 at 2000  . sodium chloride (OCEAN) 0.65 % SOLN nasal spray Place 1 spray into both nostrils every 6 (six) hours as needed (dryness).   Unknown at PRN  . traMADol (ULTRAM) 50 MG tablet Take 50 mg by mouth every 12 (twelve) hours as needed for moderate pain.   Unknown at PRN  . triamcinolone cream (KENALOG) 0.1 % Apply 1 application topically every 8 (eight) hours as needed (itching).   Unknown at PRN  . vitamin B-12 (CYANOCOBALAMIN) 100 MCG tablet Take 200 mcg by mouth daily.   04/25/2019 at 0800    Current medications: Current Facility-Administered Medications  Medication Dose Route Frequency Provider Last Rate Last Dose  . acetaminophen (TYLENOL) tablet 650 mg  650 mg Oral Q6H PRN Houston Siren, MD       Or  . acetaminophen (TYLENOL) suppository 650 mg  650 mg Rectal Q6H PRN Houston Siren, MD      . Chlorhexidine Gluconate Cloth 2 % PADS 6 each  6 each Topical Daily Eugenie Norrie, NP   6 each at 04/25/19 2100  . ciprofloxacin (CIPRO) IVPB 400 mg  400 mg Intravenous Q12H Houston Siren, MD 200 mL/hr at 04/26/19 0955 400 mg at 04/26/19 0955  . insulin aspart (novoLOG) injection 0-9 Units  0-9 Units Subcutaneous Q4H Eugenie Norrie, NP   2 Units at 04/26/19 [phone]  . metroNIDAZOLE (FLAGYL) IVPB 500 mg  500 mg Intravenous Q8H Houston Siren, MD 100 mL/hr at 04/26/19 0846 500 mg at 04/26/19 0846  . morphine 2 MG/ML injection 1-2 mg  1-2 mg Intravenous Q4H PRN Eugenie Norrie, NP    2 mg at 04/26/19 0204  . mupirocin ointment (BACTROBAN) 2 % 1 application  1 application Nasal BID Eugenie Norrie, NP   1 application at 04/26/19 1002  .  norepinephrine (LEVOPHED)  in premix infusion  0-40 mcg/min Intravenous Continuous Houston Siren, MD   Stopped at 04/26/19 [phone]  . ondansetron (ZOFRAN) tablet 4 mg  4 mg Oral Q6H PRN Houston Siren, MD       Or  . ondansetron (ZOFRAN) injection 4 mg  4 mg Intravenous Q6H PRN Houston Siren, MD   4 mg at 04/25/19 2200  . pantoprazole (PROTONIX) injection 40 mg  40 mg Intravenous Q12H Eugenie Norrie, NP   40 mg at 04/26/19 0955  . sodium bicarbonate 150 mEq in dextrose 5 % 1,000 mL infusion   Intravenous Continuous Eugenie Norrie, NP 100 mL/hr at 04/26/19 1100    . vancomycin variable dose per unstable renal function (pharmacist dosing)   Does not apply See admin instructions Delfino Lovett, MD          Allergies: Allergies  Allergen Reactions  . Atorvastatin Other (See Comments)    Joint swelling  . Duloxetine Hcl Rash  . Carbamazepine   . Lecithin   . Oxcarbazepine   . Tizanidine Hcl   . Yellow Dyes (Non-Tartrazine)     Yellow dye #5 specifically      Past Medical History: Past Medical History:  Diagnosis Date  . CAD (coronary artery disease)    a. CABG 2000; b. multiple stents; c. cardiac cath 02/05/2015: occluded mLAD, patent mid to distal LAD after the 100% occlusion with free LIMA to the mid LAD,  patent mid LAD stent w/ mild ISR, patent proximal to mid LAD stent before 100% occlusion, LCx & RCA with mild diffuse disease. EF >55%. No intervention needed. Medical management. CP likely not cardiac.   Marland Kitchen CHF (congestive heart failure) (HCC)   . Dementia (HCC)   . Diabetes mellitus without complication (HCC)   . Diastolic dysfunction    a. echo 01/2015: EF 60-65%, diastolic dysfunction, mild LVH, mildly dilated LA, mild AS (possibly calcified), mild TR,   . HTN (hypertension)   . OSA (obstructive sleep apnea)       Past Surgical History: History reviewed. No pertinent surgical history.   Family History: Family History  Problem Relation Age of Onset  . Heart disease Mother      Social History: Social History   Socioeconomic History  . Marital status: Married    Spouse name: Not on file  . Number of children: Not on file  . Years of education: Not on file  . Highest education level: Not on file  Occupational History  . Not on file  Social Needs  . Financial resource strain: Not on file  . Food insecurity:    Worry: Not on file    Inability: Not on file  . Transportation needs:    Medical: Not on file    Non-medical: Not on file  Tobacco Use  . Smoking status: Former Smoker  Substance and Sexual Activity  . Alcohol use: Never    Frequency: Never  . Drug use: Never  . Sexual activity: Not on file  Lifestyle  . Physical activity:    Days per week: Not on file    Minutes per session: Not on file  . Stress: Not on file  Relationships  . Social connections:    Talks on phone: Not on file    Gets together: Not on file    Attends religious service: Not on file    Active member of club or organization: Not on file    Attends meetings  of clubs or organizations: Not on file    Relationship status: Not on file  . Intimate partner violence:    Fear of current or ex partner: Not on file    Emotionally abused: Not on file    Physically abused: Not on file    Forced sexual activity: Not on file  Other Topics Concern  . Not on file  Social History Narrative  . Not on file     Review of Systems: Review of Systems  Constitutional: Positive for chills, diaphoresis, fever, malaise/fatigue and weight loss.  HENT: Negative.  Negative for congestion, ear discharge, ear pain, hearing loss, nosebleeds, sinus pain, sore throat and tinnitus.   Eyes: Negative.  Negative for blurred vision, double vision, photophobia, pain, discharge and redness.  Respiratory: Negative.  Negative for  cough, hemoptysis, sputum production, shortness of breath, wheezing and stridor.   Cardiovascular: Positive for leg swelling. Negative for chest pain, palpitations, orthopnea, claudication and PND.  Gastrointestinal: Positive for abdominal pain, diarrhea, melena, nausea and vomiting. Negative for blood in stool, constipation and heartburn.  Genitourinary: Negative.  Negative for dysuria, flank pain, frequency, hematuria and urgency.  Musculoskeletal: Negative.  Negative for back pain, falls, joint pain, myalgias and neck pain.  Skin: Negative.  Negative for itching and rash.  Neurological: Negative.  Negative for dizziness, tingling, tremors, sensory change, speech change, focal weakness, seizures, loss of consciousness, weakness and headaches.  Endo/Heme/Allergies: Negative.  Negative for environmental allergies and polydipsia. Does not bruise/bleed easily.  Psychiatric/Behavioral: Negative.  Negative for depression, hallucinations, memory loss, substance abuse and suicidal ideas. The patient is not nervous/anxious and does not have insomnia.     Vital Signs: Blood pressure 124/68, pulse 87, temperature 98 F (36.7 C), temperature source Oral, resp. rate (!) 23, height 6' (1.829 m), weight (!) 136.5 kg, SpO2 100 %.  Weight trends: Filed Weights   04/25/19 1459 04/25/19 2039  Weight: (!) 137.9 kg (!) 136.5 kg    Physical Exam: General: NAD, ill appearing  Head: Normocephalic, atraumatic. Moist oral mucosal membranes  Eyes: Anicteric, PERRL  Neck: Supple, trachea midline  Lungs:  Clear to auscultation  Heart: Regular rate and rhythm  Abdomen:  Tender periumbilical obese  Extremities:   trace peripheral edema.  Neurologic: Nonfocal, moving all four extremities  Skin: No lesions         Lab results: Basic Metabolic Panel: Recent Labs  Lab 04/25/19 1502 04/26/19 0131 04/26/19 0802  NA 138 140 138  K 4.0 6.3* 6.2*  CL 103 107 106  CO2 17* 15* 15*  GLUCOSE 238* 145* 168*   BUN 18 23 26*  CREATININE 1.56* 2.36* 2.69*  CALCIUM 8.9 8.2* 7.7*  MG  --  2.3  --     Liver Function Tests: Recent Labs  Lab 04/25/19 1502 04/26/19 0131  AST 38 40  ALT 21 21  ALKPHOS 91 74  BILITOT 0.6 0.6  PROT 7.3 6.3*  ALBUMIN 3.7 3.2*   Recent Labs  Lab 04/25/19 1502  LIPASE 34   No results for input(s): AMMONIA in the last 168 hours.  CBC: Recent Labs  Lab 04/25/19 1502 04/26/19 0131  WBC 17.0* 39.2*  NEUTROABS 9.1* 33.1*  HGB 11.2* 10.3*  HCT 40.4 37.3*  MCV 76.2* 77.2*  PLT 408* 319    Cardiac Enzymes: Recent Labs  Lab 04/25/19 1502  CKTOTAL 233  TROPONINI <0.03    BNP: Invalid input(s): POCBNP  CBG: Recent Labs  Lab 04/25/19 2038 04/26/19 0405 04/26/19  0732  GLUCAP 135* 121* 160*    Microbiology: Results for orders placed or performed during the hospital encounter of 04/25/19  SARS Coronavirus 2 (CEPHEID- Performed in [facility] hospital lab), Hosp Order     Status: None   Collection Time: 04/25/19  3:09 PM  Result Value Ref Range Status   SARS Coronavirus 2 NEGATIVE NEGATIVE Final    Comment: (NOTE) If result is NEGATIVE SARS-CoV-2 target nucleic acids are NOT DETECTED. The SARS-CoV-2 RNA is generally detectable in upper and lower  respiratory specimens during the acute phase of infection. The lowest  concentration of SARS-CoV-2 viral copies this assay can detect is 250  copies / mL. A negative result does not preclude SARS-CoV-2 infection  and should not be used as the sole basis for treatment or other  patient management decisions.  A negative result may occur with  improper specimen collection / handling, submission of specimen other  than nasopharyngeal swab, presence of viral mutation(s) within the  areas targeted by this assay, and inadequate number of viral copies  (<250 copies / mL). A negative result must be combined with clinical  observations, patient history, and epidemiological information. If result is  POSITIVE SARS-CoV-2 target nucleic acids are DETECTED. The SARS-CoV-2 RNA is generally detectable in upper and lower  respiratory specimens dur ing the acute phase of infection.  Positive  results are indicative of active infection with SARS-CoV-2.  Clinical  correlation with patient history and other diagnostic information is  necessary to determine patient infection status.  Positive results do  not rule out bacterial infection or co-infection with other viruses. If result is PRESUMPTIVE POSTIVE SARS-CoV-2 nucleic acids MAY BE PRESENT.   A presumptive positive result was obtained on the submitted specimen  and confirmed on repeat testing.  While 2019 novel coronavirus  (SARS-CoV-2) nucleic acids may be present in the submitted sample  additional confirmatory testing may be necessary for epidemiological  and / or clinical management purposes  to differentiate between  SARS-CoV-2 and other Sarbecovirus currently known to infect humans.  If clinically indicated additional testing with an alternate test  methodology [phone]) is advised. The SARS-CoV-2 RNA is generally  detectable in upper and lower respiratory sp ecimens during the acute  phase of infection. The expected result is Negative. Fact Sheet for Patients:  BoilerBrush.com.cy Fact Sheet for Healthcare Providers: https://pope.com/ This test is not yet approved or cleared by the [place] FDA and has been authorized for detection and/or diagnosis of SARS-CoV-2 by FDA under an Emergency Use Authorization (EUA).  This EUA will remain in effect (meaning this test can be used) for the duration of the COVID-19 declaration under Section 564(b)(1) of the Act, 21 U.S.C. section 360bbb-3(b)(1), unless the authorization is terminated or revoked sooner. Performed at [facility], [address] [place], [place] [address]   Blood Culture (routine x 2)     Status: None  (Preliminary result)   Collection Time: 04/25/19  4:24 PM  Result Value Ref Range Status   Specimen Description BLOOD BLOOD RIGHT HAND  Final   Special Requests   Final    BOTTLES DRAWN AEROBIC AND ANAEROBIC Blood Culture adequate volume   Culture   Final    NO GROWTH < 24 HOURS Performed at [facility], [address] [place], [place] [address]    Report Status PENDING  Incomplete  C difficile quick scan w PCR reflex     Status: None   Collection Time: 04/25/19  5:09  PM  Result Value Ref Range Status   C Diff antigen NEGATIVE NEGATIVE Final   C Diff toxin NEGATIVE NEGATIVE Final   C Diff interpretation No C. difficile detected.  Final    Comment: Performed at [facility], [address] [place], [place] [address]  MRSA PCR Screening     Status: Abnormal   Collection Time: 04/25/19  8:00 PM  Result Value Ref Range Status   MRSA by PCR POSITIVE (A) NEGATIVE Final    Comment:        The GeneXpert MRSA Assay (FDA approved for NASAL specimens only), is one component of a comprehensive MRSA colonization surveillance program. It is not intended to diagnose MRSA infection nor to guide or monitor treatment for MRSA infections. RESULT CALLED TO, READ BACK BY AND VERIFIED WITH: C ADELOWO 04/25/2019 AT 2213 BY [facility] Performed at [facility], [address] [place], [place] [address]   Blood Culture (routine x 2)     Status: None (Preliminary result)   Collection Time: 04/25/19  9:07 PM  Result Value Ref Range Status   Specimen Description BLOOD BLOOD RIGHT HAND  Final   Special Requests   Final    BOTTLES DRAWN AEROBIC AND ANAEROBIC Blood Culture results may not be optimal due to an inadequate volume of blood received in culture bottles   Culture   Final    NO GROWTH < 12 HOURS Performed at [facility], [address] [place], [place] [address]    Report Status PENDING  Incomplete  Gastrointestinal Panel by PCR , Stool      Status: None   Collection Time: 04/25/19  9:56 PM  Result Value Ref Range Status   Campylobacter species NOT DETECTED NOT DETECTED Final   Plesimonas shigelloides NOT DETECTED NOT DETECTED Final   Salmonella species NOT DETECTED NOT DETECTED Final   Yersinia enterocolitica NOT DETECTED NOT DETECTED Final   Vibrio species NOT DETECTED NOT DETECTED Final   Vibrio cholerae NOT DETECTED NOT DETECTED Final   Enteroaggregative E coli (EAEC) NOT DETECTED NOT DETECTED Final   Enteropathogenic E coli (EPEC) NOT DETECTED NOT DETECTED Final   Enterotoxigenic E coli (ETEC) NOT DETECTED NOT DETECTED Final   Shiga like toxin producing E coli (STEC) NOT DETECTED NOT DETECTED Final   Shigella/Enteroinvasive E coli (EIEC) NOT DETECTED NOT DETECTED Final   Cryptosporidium NOT DETECTED NOT DETECTED Final   Cyclospora cayetanensis NOT DETECTED NOT DETECTED Final   Entamoeba histolytica NOT DETECTED NOT DETECTED Final   Giardia lamblia NOT DETECTED NOT DETECTED Final   Adenovirus F40/41 NOT DETECTED NOT DETECTED Final   Astrovirus NOT DETECTED NOT DETECTED Final   Norovirus GI/GII NOT DETECTED NOT DETECTED Final   Rotavirus A NOT DETECTED NOT DETECTED Final   Sapovirus (I, II, IV, and V) NOT DETECTED NOT DETECTED Final    Comment: Performed at [facility], [address] [place], [place] [address]    Coagulation Studies: No results for input(s): LABPROT, INR in the last 72 hours.  Urinalysis: Recent Labs    04/25/19 1709  COLORURINE AMBER*  LABSPEC 1.024  PHURINE 5.0  GLUCOSEU NEGATIVE  HGBUR MODERATE*  BILIRUBINUR NEGATIVE  KETONESUR NEGATIVE  PROTEINUR 100*  NITRITE NEGATIVE  LEUKOCYTESUR NEGATIVE      Imaging: Ct Abdomen Pelvis Wo Contrast  Result Date: 04/26/2019 CLINICAL DATA:  Abdominal distension EXAM: CT ABDOMEN AND PELVIS WITHOUT CONTRAST TECHNIQUE: Multidetector CT imaging of the abdomen and pelvis was performed following the  standard protocol without IV contrast.  COMPARISON:  04/25/2019 FINDINGS: Lower chest: Subsegmental atelectasis. Hepatobiliary: Small gallstone towards the neck of the gallbladder is present. Unremarkable liver. Pancreas: Atrophic.  No mass. Spleen: Unremarkable Adrenals/Urinary Tract: Exophytic small cyst from the posterior left kidney is stable. Adrenal glands are unremarkable. Right kidney is within normal limits. Bladder is decompressed by a Foley catheter. Stomach/Bowel: Stomach is distended with oral contrast. New NG tube is in place with its tip in the body of the stomach. Minimally distended small bowel loops in the lower central abdomen are noted. No clear transition point is identified. Remainder of small bowel and colon are decompressed. There is continued wall thickening of the descending and sigmoid colon with inflammatory changes in the adjacent fat. Stranding in the adjacent fat has become more pronounced. There is no obvious pneumatosis. There is no free intraperitoneal gas. No evidence of abscess formation. There is also stranding surrounding the rectum. Rectal tube has been removed. Vascular/Lymphatic: Atherosclerotic calcifications of the aorta and iliac arteries are mild. There is no abnormal retroperitoneal adenopathy by measurement criteria. Reproductive: Normal prostate. Other: No free fluid.  Small inguinal hernias contain adipose. Musculoskeletal: Lumbosacral fusion hardware is in place with pedicle screws and stabilizing bars from L2 through S1. Neural stimulator device traverses into the posterior central spinal canal at T12-L1. The tip is at T8 and T9. No vertebral compression deformity. IMPRESSION: Inflammatory changes involving the descending colon, sigmoid colon, and rectum have become more pronounced. There is no evidence of perforation or abscess. Differential diagnosis includes inflammatory change, infection, inflammatory bowel disease, pseudomembranous colitis, and ischemia are not excluded. NG tube tip in the stomach.   Rectal tube removed. Mild small bowel ileus pattern. Electronically Signed   By: Jolaine Click M.D.   On: 04/26/2019 07:40   Ct Abdomen Pelvis Wo Contrast  Result Date: 04/25/2019 CLINICAL DATA:  Hypotension, elevated WBC, diarrhea, abdominal pain EXAM: CT ABDOMEN AND PELVIS WITHOUT CONTRAST TECHNIQUE: Multidetector CT imaging of the abdomen and pelvis was performed following the standard protocol without IV contrast. COMPARISON:  None. FINDINGS: Lower chest: Cardiomegaly and coronary artery calcifications. Hepatobiliary: No focal liver abnormality is seen. Hepatic steatosis. Gallstones. No gallbladder wall thickening, or biliary dilatation. Pancreas: Unremarkable. No pancreatic ductal dilatation or surrounding inflammatory changes. Spleen: Normal in size without focal abnormality. Adrenals/Urinary Tract: Adrenal glands are unremarkable. Kidneys are normal, without renal calculi, focal lesion, or hydronephrosis. Bladder is unremarkable. Stomach/Bowel: Stomach is within normal limits. Appendix appears normal. There is mild thickening of the descending colon, sigmoid colon, and rectum. The proximal colon is fluid-filled and there are dense stool balls in the rectum. Rectal tube is positioned in the rectum. Vascular/Lymphatic: Scattered calcific atherosclerosis. No enlarged abdominal or pelvic lymph nodes. Reproductive: No mass or other abnormality. Other: Small fat containing bilateral inguinal hernias. No abdominopelvic ascites. Musculoskeletal: No acute or significant osseous findings. IMPRESSION: 1. There is mild thickening of the descending colon, sigmoid colon, and rectum. This appearance suggests nonspecific infectious, inflammatory, or ischemic colitis. The proximal colon is fluid-filled and there are dense stool balls in the rectum. Rectal tube is positioned in the rectum. 2.  Other chronic and incidental findings as detailed above. Electronically Signed   By: Lauralyn Primes M.D.   On: 04/25/2019 18:38   Dg  Abd 1 View  Result Date: 04/26/2019 CLINICAL DATA:  NG tube placement EXAM: ABDOMEN - 1 VIEW COMPARISON:  CT from the same day FINDINGS: The tip of the NG tube projects over the  gastric body. The bowel gas pattern is nonspecific. IMPRESSION: NG tube projects over the gastric body. Electronically Signed   By: Katherine Mantlehristopher  Green M.D.   On: 04/26/2019 03:32   Ct Head Wo Contrast  Result Date: 04/25/2019 CLINICAL DATA:  Status post unwitnessed fall, hypotensive. EXAM: CT HEAD WITHOUT CONTRAST TECHNIQUE: Contiguous axial images were obtained from the base of the skull through the vertex without intravenous contrast. COMPARISON:  Head CT dated 09/30/2014. FINDINGS: Brain: Generalized age related parenchymal volume loss with commensurate dilatation of the ventricles and sulci. Mild chronic small vessel ischemic changes within the periventricular white matter regions bilaterally. Ventricles are stable in size. No mass, hemorrhage, edema or other evidence of acute parenchymal abnormality. No extra-axial hemorrhage. Vascular: Chronic calcified atherosclerotic changes of the large vessels at the skull base. No unexpected hyperdense vessel. Skull: Normal. Negative for fracture or focal lesion. Sinuses/Orbits: No acute finding. Other: None. IMPRESSION: 1. No acute findings. No intracranial mass, hemorrhage or edema. No skull fracture. 2. Atrophy and chronic ischemic changes in the white matter. Electronically Signed   By: Bary RichardStan  Maynard M.D.   On: 04/25/2019 15:52   Dg Chest Portable 1 View  Result Date: 04/25/2019 CLINICAL DATA:  Chest pain after fall.  Diaphoresis. EXAM: PORTABLE CHEST 1 VIEW COMPARISON:  Radiographs 05/22/2016. FINDINGS: 1514 hours. There are slightly lower lung volumes. There is stable cardiomegaly status post median sternotomy. Aortic atherosclerosis and a spinal stimulator are noted. There is vascular congestion with mild bibasilar atelectasis. No edema, confluent airspace opacity, pleural  effusion or pneumothorax. No acute osseous findings. Telemetry leads overlie the chest. IMPRESSION: No acute posttraumatic findings identified. Cardiomegaly with vascular congestion and mild bibasilar atelectasis. Electronically Signed   By: Carey BullocksWilliam  Veazey M.D.   On: 04/25/2019 15:48      Assessment & Plan: Thomas Watkins is a 73 y.o. white male with diabetes mellitus type II insulin dependent, dementia, hypertension, congestive heart failure, dementia, obesity, coronary artery disease, obstructive sleep apnea, who [facility] for Fall, initial encounter [W19.XXXA] Hypotension, unspecified hypotension type [I95.9] Diarrhea, unspecified type [R19.7]   1. Acute renal failure with hyperkalemia and metabolic acidosis. Baseline creatinine of 1 from 2015.  Acute renal failure from current illness. Lactic acidosis is concerning.  - holding lisinopril - Agree with sodium bicarbonate infusion - Nonoliguric: monitor urine output and volume status. - Due to acidosis: will consider dialysis if no improvement.  - Check metformin level.   2. Anemia: with acute blood loss from GI bleed.  Appreciate GI and surgery input.  3. Sepsis: with leukocytosis. Requiring vasopressors. Cultures pending. Empiric ciprofloxacin, metronidazole. Vancomycin given in the ED.   4. Diabetes mellitus type II: insulin dependent - hold metformin  LOS: 1 Sandralee Tarkington 5/26/202011:11 AM

## 2019-04-26 NOTE — TOC Initial Note (Signed)
Transition of Care [facility]) - Initial/Assessment Note    Patient Details  Name: Thomas Watkins MRN: [id number] Date of Birth: 1946/01/06  Transition of Care [facility]) CM/SW Contact:    Allayne Butcher, RN Phone Number: 04/26/2019, 12:29 PM  Clinical Narrative:                 Patient is admitted with sepsis and is currently in the ICU.  Patient was brought in by EMS after a fall at [facility] where patient has been living since 2015.  Patient is married wife is Thomas Watkins.  Wife reports that before the COVID epidemic she would visit patient everyday but she has been able to speak with him over the phone and through the window of his room.  Wife reports that patient has dementia and went to [facility] after taking out a loan for $45,000 and then giving it away, wife states she has no idea where the money went.  Patient is followed by psychiatry at [facility] and all of his primary care is through the [facility].  Wife consents to transfer when patient is medically stable.  ICU MD does not feel that patient is stable for transfer at this time but maybe tomorrow.  Plan is for patient to get dialysis today.  Message left with the [facility] about patient admission.  RNCM will cont to monitor patient progress.    Expected Discharge Plan: Home w Home Health Services(From [facility]) Barriers to Discharge: Continued Medical Work up   Patient Goals and CMS Choice Patient states their goals for this hospitalization and ongoing recovery are:: To return to [facility] when better      Expected Discharge Plan and Services Expected Discharge Plan: Home w Home Health Services(From [facility])       Living arrangements for the past 2 months: Assisted Living Facility                                      Prior Living Arrangements/Services Living arrangements for the past 2 months: Assisted Living Facility Lives with:: Facility Resident Patient language and need for interpreter  reviewed:: No Do you feel safe going back to the place where you live?: Yes      Need for Family Participation in Patient Care: Yes (Comment) Care giver support system in place?: Yes (comment)(wife) Current home services: DME(walker and cane) Criminal Activity/Legal Involvement Pertinent to Current Situation/Hospitalization: No - Comment as needed  Activities of Daily Living   ADL Screening (condition at time of admission) Patient's cognitive ability adequate to safely complete daily activities?: Yes Is the patient deaf or have difficulty hearing?: No Does the patient have difficulty seeing, even when wearing glasses/contacts?: No Does the patient have difficulty concentrating, remembering, or making decisions?: No Patient able to express need for assistance with ADLs?: Yes Does the patient have difficulty dressing or bathing?: Yes Independently performs ADLs?: No Communication: Independent Dressing (OT): Independent Grooming: Independent Feeding: Independent Bathing: Needs assistance Is this a change from baseline?: Pre-admission baseline Toileting: Independent In/Out Bed: Independent Walks in Home: Independent Does the patient have difficulty walking or climbing stairs?: No Weakness of Legs: None Weakness of Arms/Hands: None  Permission Sought/Granted Permission sought to share information with : Facility Medical sales representative, Family Supports Permission granted to share information with : Yes, Verbal Permission Granted     Permission granted to share info w AGENCY: [facility]  Permission granted to share info w Relationship: Wife     Emotional Assessment Appearance:: Appears stated age Attitude/Demeanor/Rapport: Engaged Affect (typically observed): Accepting Orientation: : Oriented to Self, Oriented to Place, Oriented to Situation Alcohol / Substance Use: Not Applicable Psych Involvement: No (comment)  Admission diagnosis:  Fall, initial encounter  [W19.XXXA] Hypotension, unspecified hypotension type [I95.9] Diarrhea, unspecified type [R19.7] Patient Active Problem List   Diagnosis Date Noted  . Septic shock (HCC) 04/25/2019  . Atrial fibrillation with RVR (HCC) 05/20/2016  . CAD (coronary artery disease)   . Diastolic dysfunction   . HTN (hypertension)   . OSA (obstructive sleep apnea)   . Dementia (HCC)   . B12 deficiency 10/16/2014  . Avitaminosis D 10/08/2014  . Chronic pain 10/08/2014  . Benign prostatic hypertrophy without urinary obstruction 10/06/2014  . General psychoses (HCC) 10/05/2014  . Encounter for surgical follow-up care 12/02/2012  . Adiposity 07/08/2012  . Carotid artery obstruction 07/08/2012  . Chest pain 07/08/2012  . Chronic ischemic heart disease 07/08/2012  . Extremity pain 07/08/2012  . Combined fat and carbohydrate induced hyperlipemia 07/08/2012  . Prinzmetal angina (HCC) 07/08/2012   PCP:  [facility] Pharmacy:  No Pharmacies Listed    Social Determinants of Health (SDOH) Interventions    Readmission Risk Interventions No flowsheet data found.

## 2019-04-26 NOTE — Consult Note (Signed)
Pharmacy Antibiotic Note  Thomas Watkins is a 73 y.o. male admitted on 04/25/2019 with sepsis.  Pharmacy has been consulted for vancomycin dosing. Pt is on metronidazole and ciprofloxacin.   Plan: Pt received a vancomycin loading dose of 2500 mg x 1 on 5/25 @1849 . Creatinine is trending up 1.56 to 2.36. Will dose by levels, renal function is unstable. Will order a 24 hour level to reassess.   Height: 6' (182.9 cm) Weight: (!) 300 lb 14.9 oz (136.5 kg) IBW/kg (Calculated) : 77.6  Temp (24hrs), Avg:97.6 F (36.4 C), Min:97.5 F (36.4 C), Max:97.7 F (36.5 C)  Recent Labs  Lab 04/25/19 1502 04/25/19 1730 04/25/19 2107 04/26/19 0131  WBC 17.0*  --   --  39.2*  CREATININE 1.56*  --   --  2.36*  LATICACIDVEN  --  10.2* 9.5* 8.9*    Estimated Creatinine Clearance: 38.7 mL/min (A) (by C-G formula based on SCr of 2.36 mg/dL (H)).    Allergies  Allergen Reactions  . Atorvastatin Other (See Comments)    Joint swelling  . Duloxetine Hcl Rash  . Carbamazepine   . Lecithin   . Oxcarbazepine   . Tizanidine Hcl   . Yellow Dyes (Non-Tartrazine)     Yellow dye #5 specifically    Antimicrobials this admission: 5/26 vancomycin >>  5/25 cipro >>  5/25 metronidazole >>  Dose adjustments this admission: None  Microbiology results: 5/25 BCx: pending 5/25 UCx: pending  5/25 MRSA PCR: positive 5/25 COVID-19 (-)  Thank you for allowing pharmacy to be a part of this patient's care.  Ronnald Ramp, PharmD, BCPS  Clinical Pharmacist  04/26/2019 3:43 AM

## 2019-04-26 NOTE — Progress Notes (Signed)
[facility] [place] [facility]   PATIENT NAME: Thomas CanardHerman Watkins    MR#:  [id number]  DATE OF BIRTH:  04/24/1946  SUBJECTIVE:  CHIEF COMPLAINT:   Chief Complaint  Patient presents with   Fall  Denies any other complaints other than mild abdominal pain REVIEW OF SYSTEMS:  Review of Systems  Constitutional: Negative for diaphoresis, fever, malaise/fatigue and weight loss.  HENT: Negative for ear discharge, ear pain, hearing loss, nosebleeds, sore throat and tinnitus.   Eyes: Negative for blurred vision and pain.  Respiratory: Negative for cough, hemoptysis, shortness of breath and wheezing.   Cardiovascular: Negative for chest pain, palpitations, orthopnea and leg swelling.  Gastrointestinal: Positive for abdominal pain. Negative for blood in stool, constipation, diarrhea, heartburn, nausea and vomiting.  Genitourinary: Negative for dysuria, frequency and urgency.  Musculoskeletal: Negative for back pain and myalgias.  Skin: Negative for itching and rash.  Neurological: Negative for dizziness, tingling, tremors, focal weakness, seizures, weakness and headaches.  Psychiatric/Behavioral: Negative for depression. The patient is not nervous/anxious.     DRUG ALLERGIES:   Allergies  Allergen Reactions   Atorvastatin Other (See Comments)    Joint swelling   Duloxetine Hcl Rash   Carbamazepine    Lecithin    Oxcarbazepine    Tizanidine Hcl    Yellow Dyes (Non-Tartrazine)     Yellow dye #5 specifically   VITALS:  Blood pressure (!) 139/117, pulse 93, temperature 98.3 F (36.8 C), temperature source Oral, resp. rate (!) 37, height 6' (1.829 m), weight (!) 136.5 kg, SpO2 96 %. PHYSICAL EXAMINATION:  Physical Exam HENT:     Head: Normocephalic and atraumatic.  Eyes:     Conjunctiva/sclera: Conjunctivae normal.     Pupils: Pupils are equal, round, and reactive to light.  Neck:     Musculoskeletal: Normal range of motion and neck supple.     Thyroid:  No thyromegaly.     Trachea: No tracheal deviation.  Cardiovascular:     Rate and Rhythm: Normal rate and regular rhythm.     Heart sounds: Normal heart sounds.  Pulmonary:     Effort: Pulmonary effort is normal. No respiratory distress.     Breath sounds: Normal breath sounds. No wheezing.  Chest:     Chest wall: No tenderness.  Abdominal:     General: Bowel sounds are normal. There is no distension.     Palpations: Abdomen is soft.     Tenderness: There is no abdominal tenderness.     Comments: Has rectal tube in place with blood in it  Musculoskeletal: Normal range of motion.  Skin:    General: Skin is warm and dry.     Findings: No rash.  Neurological:     Mental Status: He is alert and oriented to person, place, and time.     Cranial Nerves: No cranial nerve deficit.    LABORATORY PANEL:  Male CBC Recent Labs  Lab 04/26/19 1256  WBC 22.0*  HGB 9.0*  HCT 30.9*  PLT 246   ------------------------------------------------------------------------------------------------------------------ Chemistries  Recent Labs  Lab 04/26/19 0131 04/26/19 0802  NA 140 138  K 6.3* 6.2*  CL 107 106  CO2 15* 15*  GLUCOSE 145* 168*  BUN 23 26*  CREATININE 2.36* 2.69*  CALCIUM 8.2* 7.7*  MG 2.3  --   AST 40  --   ALT 21  --   ALKPHOS 74  --   BILITOT 0.6  --    RADIOLOGY:  Ct  Abdomen Pelvis Wo Contrast  Result Date: 04/26/2019 CLINICAL DATA:  Abdominal distension EXAM: CT ABDOMEN AND PELVIS WITHOUT CONTRAST TECHNIQUE: Multidetector CT imaging of the abdomen and pelvis was performed following the standard protocol without IV contrast. COMPARISON:  04/25/2019 FINDINGS: Lower chest: Subsegmental atelectasis. Hepatobiliary: Small gallstone towards the neck of the gallbladder is present. Unremarkable liver. Pancreas: Atrophic.  No mass. Spleen: Unremarkable Adrenals/Urinary Tract: Exophytic small cyst from the posterior left kidney is stable. Adrenal glands are unremarkable. Right  kidney is within normal limits. Bladder is decompressed by a Foley catheter. Stomach/Bowel: Stomach is distended with oral contrast. New NG tube is in place with its tip in the body of the stomach. Minimally distended small bowel loops in the lower central abdomen are noted. No clear transition point is identified. Remainder of small bowel and colon are decompressed. There is continued wall thickening of the descending and sigmoid colon with inflammatory changes in the adjacent fat. Stranding in the adjacent fat has become more pronounced. There is no obvious pneumatosis. There is no free intraperitoneal gas. No evidence of abscess formation. There is also stranding surrounding the rectum. Rectal tube has been removed. Vascular/Lymphatic: Atherosclerotic calcifications of the aorta and iliac arteries are mild. There is no abnormal retroperitoneal adenopathy by measurement criteria. Reproductive: Normal prostate. Other: No free fluid.  Small inguinal hernias contain adipose. Musculoskeletal: Lumbosacral fusion hardware is in place with pedicle screws and stabilizing bars from L2 through S1. Neural stimulator device traverses into the posterior central spinal canal at T12-L1. The tip is at T8 and T9. No vertebral compression deformity. IMPRESSION: Inflammatory changes involving the descending colon, sigmoid colon, and rectum have become more pronounced. There is no evidence of perforation or abscess. Differential diagnosis includes inflammatory change, infection, inflammatory bowel disease, pseudomembranous colitis, and ischemia are not excluded. NG tube tip in the stomach.  Rectal tube removed. Mild small bowel ileus pattern. Electronically Signed   By: Jolaine Click M.D.   On: 04/26/2019 07:40   Ct Abdomen Pelvis Wo Contrast  Result Date: 04/25/2019 CLINICAL DATA:  Hypotension, elevated WBC, diarrhea, abdominal pain EXAM: CT ABDOMEN AND PELVIS WITHOUT CONTRAST TECHNIQUE: Multidetector CT imaging of the abdomen  and pelvis was performed following the standard protocol without IV contrast. COMPARISON:  None. FINDINGS: Lower chest: Cardiomegaly and coronary artery calcifications. Hepatobiliary: No focal liver abnormality is seen. Hepatic steatosis. Gallstones. No gallbladder wall thickening, or biliary dilatation. Pancreas: Unremarkable. No pancreatic ductal dilatation or surrounding inflammatory changes. Spleen: Normal in size without focal abnormality. Adrenals/Urinary Tract: Adrenal glands are unremarkable. Kidneys are normal, without renal calculi, focal lesion, or hydronephrosis. Bladder is unremarkable. Stomach/Bowel: Stomach is within normal limits. Appendix appears normal. There is mild thickening of the descending colon, sigmoid colon, and rectum. The proximal colon is fluid-filled and there are dense stool balls in the rectum. Rectal tube is positioned in the rectum. Vascular/Lymphatic: Scattered calcific atherosclerosis. No enlarged abdominal or pelvic lymph nodes. Reproductive: No mass or other abnormality. Other: Small fat containing bilateral inguinal hernias. No abdominopelvic ascites. Musculoskeletal: No acute or significant osseous findings. IMPRESSION: 1. There is mild thickening of the descending colon, sigmoid colon, and rectum. This appearance suggests nonspecific infectious, inflammatory, or ischemic colitis. The proximal colon is fluid-filled and there are dense stool balls in the rectum. Rectal tube is positioned in the rectum. 2.  Other chronic and incidental findings as detailed above. Electronically Signed   By: Lauralyn Primes M.D.   On: 04/25/2019 18:38   Dg Abd 1 View  Result Date: 04/26/2019 CLINICAL DATA:  NG tube placement EXAM: ABDOMEN - 1 VIEW COMPARISON:  CT from the same day FINDINGS: The tip of the NG tube projects over the gastric body. The bowel gas pattern is nonspecific. IMPRESSION: NG tube projects over the gastric body. Electronically Signed   By: Katherine Mantle M.D.   On:  04/26/2019 03:32   ASSESSMENT AND PLAN:  73 y.o. male with a known history of obesity, dementia, diabetes, history of coronary disease status post bypass, obstructive sleep apnea, chronic diastolic dysfunction who presents to the hospital from a assisted living secondary to fall and noted to be significantly hypotensive with systolic blood pressures in the 80s.  1.  Shock- suspected to be either septic versus hypovolemic shock. - Patient having significant diarrhea and excessive volume loss. - Continue aggressive hydration with IV fluids, follow hemodynamics. consider vasopressors.  Patient to be admitted to stepdown level of care. -continue Cipro Flagyl for now given patient's CT abdomen pelvis findings.  2.    Possible ischemic colitis: Giving loose stool, none here in the hospital -Suspected source of patient's shock.  Patient stool for C. difficile is negative, also neg comprehensive culture. - CT abdomen is suggestive of colitis questionable infectious versus inflammatory.  Empirically treat the patient with IV ciprofloxacin and Flagyl.  - Appreciate gastroenterology input - could be ischemic colitis  3.  Leukocytosis-secondary to acute diarrhea. -Follow-up with IV antibiotic therapy.  4.  Diabetes type 2 with neuropathy-hold patient's schedule insulin as patient to be n.p.o. for now. Hold Metformin for now.   -Will place on sliding scale insulin.  Follow blood sugars.  5.  Diabetic neuropathy- we will resume oral gabapentin when patient can take p.o.  6.  History of atrial fibrillation-rate controlled.  Hold patient's Toprol given the relative hypotension. -Also hold Xarelto given the fact that patient's stools are heme positive for now.  7.  History of coronary disease-patient has no acute chest pain. - hold Plavix due to Heme + stools.    8. Hyperlipidemia - cont. Crestor.   9. Essential HTN -hold all patient's antihypertensives given the severe hypotension and  shock.  10. Acute renal failure with hyperkalemia and metabolic acidosis. Baseline creatinine of 1 from 2015.  Acute renal failure from current illness. Lactic acidosis + - hold lisinopril - start bicarbonate infusion -May need dialysis if no improvement -Nephrology following  11. Acute blood loss anemia: with GI bleed.  Appreciate GI and surgery input.    Patient remains critically sick and high risk for cardiorespiratory failure and multiorgan failure including death  All the records are reviewed and case discussed with Care Management/Social Worker. Management plans discussed with the patient, intensivist and they are in agreement.  CODE STATUS: DNR  TOTAL TIME (critical care) TAKING CARE OF THIS PATIENT: 35 minutes.   More than 50% of the time was spent in counseling/coordination of care: YES  POSSIBLE D/C IN 3-4 DAYS, DEPENDING ON CLINICAL CONDITION.   Delfino Lovett M.D on 04/26/2019 at 4:43 PM  Between 7am to 6pm - Pager - [phone]  After 6pm go to [email or web address] - Social research officer, government  [facility]  Office  [phone]  CC: Primary care physician; [facility]  Note: This dictation was prepared with Dragon dictation along with smaller phrase technology. Any transcriptional errors that result from this process are unintentional.

## 2019-04-26 NOTE — Consult Note (Signed)
Thomas Watkins , MD [address], [address], [place], [place], [id number] [address], [place], [place], [address] Phone: [phone]  Fax: [phone]  Consultation  Referring Provider:Dr Karna ChristmasAleskerov  Primary Care Physician:  [facility] Primary Gastroenterologist:  Dr. Brent GeneralNone          Reason for Consultation:     GI bleed   Date of Admission:  04/25/2019 Date of Consultation:  04/26/2019         HPI:   Thomas LarocheHerman Gaither Watkins is a 73 y.o. male admitted on 04/25/2019 with altered mental status. H/o CAD,CABG,OSA. On admission was hypotensive, had severe diarrhea which began 2 days prior which was bloody on and off. Lactate was elevated. He underwent a CT scan of the abdomen which showed thickening of the descending colon , sigmoid colon and rectum . Differentials were ischemic vs inflammatory. Repeat CT scan this morning showed mild small bowel ileus and similar colonic findings as yesterday. On admission was in AKI, metabolic acidosis. C diff ,COVID 19 , negative.   He says he was doing well till yesterday when all of a sudden he felt light headed when he stood up and fainted. Subsequently started having diarrhea and some abdominal pain. Denies any NSAID use . On Plavix. Last colonoscopy many years back which he recalls was normal.   At present wants to eat and drink, having bloody output in his rectal tube probably 150 cc since 9 am this morning. Definitely not having profuse diarrhea today.   Labs suggest a microcytic anemia - new onset   Past Medical History:  Diagnosis Date   CAD (coronary artery disease)    a. CABG 2000; b. multiple stents; c. cardiac cath 02/05/2015: occluded mLAD, patent mid to distal LAD after the 100% occlusion with free LIMA to the mid LAD,  patent mid LAD stent w/ mild ISR, patent proximal to mid LAD stent before 100% occlusion, LCx & RCA with mild diffuse disease. EF >55%. No intervention needed. Medical management. CP likely not cardiac.    CHF  (congestive heart failure) (HCC)    Dementia (HCC)    Diabetes mellitus without complication (HCC)    Diastolic dysfunction    a. echo 01/2015: EF 60-65%, diastolic dysfunction, mild LVH, mildly dilated LA, mild AS (possibly calcified), mild TR,    HTN (hypertension)    OSA (obstructive sleep apnea)     History reviewed. No pertinent surgical history.  Prior to Admission medications   Medication Sig Start Date End Date Taking? Authorizing Provider  acetaminophen (TYLENOL) 325 MG tablet Take 650 mg by mouth 3 (three) times daily.    Yes [provider]  acetaminophen (TYLENOL) 325 MG tablet Take 650 mg by mouth every 6 (six) hours as needed for mild pain or fever.   Yes [provider]  alum & mag hydroxide-simeth (MAALOX PLUS) 400-400-40 MG/5ML suspension Take 5 mLs by mouth every 6 (six) hours as needed for indigestion (take with lidocaine).   Yes [provider]  carbamide peroxide (DEBROX) 6.5 % OTIC solution Place 10 drops into both ears every Thursday.   Yes [provider]  cetirizine (ZYRTEC) 10 MG tablet Take 10 mg by mouth daily.   Yes [provider]  clopidogrel (PLAVIX) 75 MG tablet Take 75 mg by mouth daily.   Yes [provider]  Dextran 70-Hypromellose 0.1-0.3 % SOLN Place 1 drop into both eyes every 6 (six) hours as needed (dry eyes).   Yes [provider]  diclofenac sodium (VOLTAREN) 1 % GEL Place 2 g onto the skin every 8 (eight) hours as needed (pain).    Yes [provider]  docusate sodium (COLACE) 100 MG capsule Take 100 mg by mouth 2 (two) times daily.   Yes [provider]  DULoxetine (CYMBALTA) 60 MG capsule Take 60 mg by mouth daily.   Yes [provider]  famotidine (PEPCID) 20 MG tablet Take 20 mg by mouth daily.   Yes [provider]  ferrous sulfate 325 (65 FE) MG tablet Take 325 mg by mouth every Monday, Wednesday, and Friday.   Yes [provider]   fluticasone (FLONASE) 50 MCG/ACT nasal spray Place 1 spray into both nostrils daily.    Yes [provider]  gabapentin (NEURONTIN) 300 MG capsule Take 300 mg by mouth 3 (three) times daily.   Yes [provider]  hydrocortisone 2.5 % cream Apply 1 application topically every 12 (twelve) hours as needed (itching).   Yes [provider]  insulin aspart (NOVOLOG) 100 UNIT/ML injection Inject 25 Units into the skin 3 (three) times daily before meals.   Yes [provider]  insulin glargine (LANTUS) 100 UNIT/ML injection Inject 50 Units into the skin 2 (two) times daily.   Yes [provider]  lidocaine (XYLOCAINE) 2 % solution Use as directed 5 mLs in the mouth or throat every 6 (six) hours as needed for mouth pain (take with Maalox).   Yes [provider]  lidocaine (XYLOCAINE) 5 % ointment Apply 1 application topically every 12 (twelve) hours as needed for mild pain or moderate pain.   Yes [provider]  lisinopril (ZESTRIL) 20 MG tablet Take 10 mg by mouth daily.    Yes [provider]  meclizine (ANTIVERT) 25 MG tablet Take 25 mg by mouth every 8 (eight) hours as needed for dizziness.   Yes [provider]  memantine (NAMENDA) 5 MG tablet Take 1 tablet (5 mg total) by mouth 2 (two) times daily. 05/23/16  Yes Gouru, Deanna Artis, MD  metFORMIN (GLUCOPHAGE) 500 MG tablet Take 500 mg by mouth at bedtime.   Yes [provider]  metFORMIN (GLUCOPHAGE) 500 MG tablet Take 1,000 mg by mouth daily.   Yes [provider]  metoprolol succinate (TOPROL-XL) 50 MG 24 hr tablet Take 50 mg by mouth daily. Take with or immediately following a meal.   Yes [provider]  omeprazole (PRILOSEC) 40 MG capsule Take 40 mg by mouth 2 (two) times a day.   Yes [provider]  ranolazine (RANEXA) 500 MG 12 hr tablet Take 500 mg by mouth 2 (two) times daily.   Yes [provider]  rivaroxaban (XARELTO) 20  MG TABS tablet Take 20 mg by mouth daily with breakfast.    Yes [provider]  rosuvastatin (CRESTOR) 20 MG tablet Take 20 mg by mouth at bedtime.    Yes [provider]  sodium chloride (OCEAN) 0.65 % SOLN nasal spray Place 1 spray into both nostrils every 6 (six) hours as needed (dryness).   Yes [provider]  traMADol (ULTRAM) 50 MG tablet Take 50 mg by mouth every 12 (twelve) hours as needed for moderate pain.   Yes [provider]  triamcinolone cream (KENALOG) 0.1 % Apply 1 application topically every 8 (eight) hours as needed (itching).   Yes [provider]  vitamin B-12 (CYANOCOBALAMIN) 100 MCG tablet Take 200 mcg by mouth daily.   Yes  [provider]    Family History  Problem Relation Age of Onset   Heart disease Mother      Social History   Tobacco Use   Smoking status: Former Smoker  Substance Use Topics   Alcohol use: Never    Frequency: Never   Drug use: Never    Allergies as of 04/25/2019 - Review Complete 04/25/2019  Allergen Reaction Noted   Atorvastatin Other (See Comments) 05/20/2016   Duloxetine hcl Rash 05/20/2016   Carbamazepine  05/20/2016   Lecithin  05/20/2016   Oxcarbazepine  05/20/2016   Tizanidine hcl  05/20/2016   Yellow dyes (non-tartrazine)  05/20/2016    Review of Systems:    All systems reviewed and negative except where noted in HPI.   Physical Exam:  Vital signs in last 24 hours: Temp:  [97.5 F (36.4 C)-98.3 F (36.8 C)] 98.3 F (36.8 C) (05/26 1200) Pulse Rate:  [46-100] 84 (05/26 1430) Resp:  [0-32] 22 (05/26 1430) BP: (69-150)/(34-134) 138/58 (05/26 1430) SpO2:  [83 %-100 %] 97 % (05/26 1430) Weight:  [136.5 kg] 136.5 kg (05/25 2039) Last BM Date: 04/26/19 General:   Pleasant, cooperative in NAD Head:  Normocephalic and atraumatic. Eyes:   No icterus.   Conjunctiva pink. PERRLA. Ears:  Normal auditory acuity. Neck:  Supple; no masses or  thyroidomegaly Lungs: Respirations even and unlabored. Lungs clear to auscultation bilaterally.   No wheezes, crackles, or rhonchi.  Heart:  Regular rate and rhythm;  Without murmur, clicks, rubs or gallops Abdomen:  Soft, nondistended, nontender. Normal bowel sounds. No appreciable masses or hepatomegaly.  No rebound or guarding.  Neurologic:  Alert and oriented x3;  grossly normal neurologically. Skin:  Intact without significant lesions or rashes. Cervical Nodes:  No significant cervical adenopathy. Psych:  Alert and cooperative. Normal affect.  LAB RESULTS: Recent Labs    04/25/19 1502 04/26/19 0131 04/26/19 1256  WBC 17.0* 39.2* 22.0*  HGB 11.2* 10.3* 9.0*  HCT 40.4 37.3* 30.9*  PLT 408* 319 246   BMET Recent Labs    04/25/19 1502 04/26/19 0131 04/26/19 0802  NA 138 140 138  K 4.0 6.3* 6.2*  CL 103 107 106  CO2 17* 15* 15*  GLUCOSE 238* 145* 168*  BUN 18 23 26*  CREATININE 1.56* 2.36* 2.69*  CALCIUM 8.9 8.2* 7.7*   LFT Recent Labs    04/26/19 0131  PROT 6.3*  ALBUMIN 3.2*  AST 40  ALT 21  ALKPHOS 74  BILITOT 0.6   PT/INR No results for input(s): LABPROT, INR in the last 72 hours.  STUDIES: Ct Abdomen Pelvis Wo Contrast  Result Date: 04/26/2019 CLINICAL DATA:  Abdominal distension EXAM: CT ABDOMEN AND PELVIS WITHOUT CONTRAST TECHNIQUE: Multidetector CT imaging of the abdomen and pelvis was performed following the standard protocol without IV contrast. COMPARISON:  04/25/2019 FINDINGS: Lower chest: Subsegmental atelectasis. Hepatobiliary: Small gallstone towards the neck of the gallbladder is present. Unremarkable liver. Pancreas: Atrophic.  No mass. Spleen: Unremarkable Adrenals/Urinary Tract: Exophytic small cyst from the posterior left kidney is stable. Adrenal glands are unremarkable. Right kidney is within normal limits. Bladder is decompressed by a Foley catheter. Stomach/Bowel: Stomach is distended with oral contrast. New NG tube is in place with its tip  in the body of the stomach. Minimally distended small bowel loops in the lower central abdomen are noted. No clear transition point is identified. Remainder of small bowel and colon are decompressed. There is continued wall thickening of the descending and sigmoid colon  with inflammatory changes in the adjacent fat. Stranding in the adjacent fat has become more pronounced. There is no obvious pneumatosis. There is no free intraperitoneal gas. No evidence of abscess formation. There is also stranding surrounding the rectum. Rectal tube has been removed. Vascular/Lymphatic: Atherosclerotic calcifications of the aorta and iliac arteries are mild. There is no abnormal retroperitoneal adenopathy by measurement criteria. Reproductive: Normal prostate. Other: No free fluid.  Small inguinal hernias contain adipose. Musculoskeletal: Lumbosacral fusion hardware is in place with pedicle screws and stabilizing bars from L2 through S1. Neural stimulator device traverses into the posterior central spinal canal at T12-L1. The tip is at T8 and T9. No vertebral compression deformity. IMPRESSION: Inflammatory changes involving the descending colon, sigmoid colon, and rectum have become more pronounced. There is no evidence of perforation or abscess. Differential diagnosis includes inflammatory change, infection, inflammatory bowel disease, pseudomembranous colitis, and ischemia are not excluded. NG tube tip in the stomach.  Rectal tube removed. Mild small bowel ileus pattern. Electronically Signed   By: Jolaine Click M.D.   On: 04/26/2019 07:40   Ct Abdomen Pelvis Wo Contrast  Result Date: 04/25/2019 CLINICAL DATA:  Hypotension, elevated WBC, diarrhea, abdominal pain EXAM: CT ABDOMEN AND PELVIS WITHOUT CONTRAST TECHNIQUE: Multidetector CT imaging of the abdomen and pelvis was performed following the standard protocol without IV contrast. COMPARISON:  None. FINDINGS: Lower chest: Cardiomegaly and coronary artery calcifications.  Hepatobiliary: No focal liver abnormality is seen. Hepatic steatosis. Gallstones. No gallbladder wall thickening, or biliary dilatation. Pancreas: Unremarkable. No pancreatic ductal dilatation or surrounding inflammatory changes. Spleen: Normal in size without focal abnormality. Adrenals/Urinary Tract: Adrenal glands are unremarkable. Kidneys are normal, without renal calculi, focal lesion, or hydronephrosis. Bladder is unremarkable. Stomach/Bowel: Stomach is within normal limits. Appendix appears normal. There is mild thickening of the descending colon, sigmoid colon, and rectum. The proximal colon is fluid-filled and there are dense stool balls in the rectum. Rectal tube is positioned in the rectum. Vascular/Lymphatic: Scattered calcific atherosclerosis. No enlarged abdominal or pelvic lymph nodes. Reproductive: No mass or other abnormality. Other: Small fat containing bilateral inguinal hernias. No abdominopelvic ascites. Musculoskeletal: No acute or significant osseous findings. IMPRESSION: 1. There is mild thickening of the descending colon, sigmoid colon, and rectum. This appearance suggests nonspecific infectious, inflammatory, or ischemic colitis. The proximal colon is fluid-filled and there are dense stool balls in the rectum. Rectal tube is positioned in the rectum. 2.  Other chronic and incidental findings as detailed above. Electronically Signed   By: Lauralyn Primes M.D.   On: 04/25/2019 18:38   Dg Abd 1 View  Result Date: 04/26/2019 CLINICAL DATA:  NG tube placement EXAM: ABDOMEN - 1 VIEW COMPARISON:  CT from the same day FINDINGS: The tip of the NG tube projects over the gastric body. The bowel gas pattern is nonspecific. IMPRESSION: NG tube projects over the gastric body. Electronically Signed   By: Katherine Mantle M.D.   On: 04/26/2019 03:32   Ct Head Wo Contrast  Result Date: 04/25/2019 CLINICAL DATA:  Status post unwitnessed fall, hypotensive. EXAM: CT HEAD WITHOUT CONTRAST TECHNIQUE:  Contiguous axial images were obtained from the base of the skull through the vertex without intravenous contrast. COMPARISON:  Head CT dated 09/30/2014. FINDINGS: Brain: Generalized age related parenchymal volume loss with commensurate dilatation of the ventricles and sulci. Mild chronic small vessel ischemic changes within the periventricular white matter regions bilaterally. Ventricles are stable in size. No mass, hemorrhage, edema or other evidence of acute parenchymal abnormality.  No extra-axial hemorrhage. Vascular: Chronic calcified atherosclerotic changes of the large vessels at the skull base. No unexpected hyperdense vessel. Skull: Normal. Negative for fracture or focal lesion. Sinuses/Orbits: No acute finding. Other: None. IMPRESSION: 1. No acute findings. No intracranial mass, hemorrhage or edema. No skull fracture. 2. Atrophy and chronic ischemic changes in the white matter. Electronically Signed   By: Bary Richard M.D.   On: 04/25/2019 15:52   Dg Chest Portable 1 View  Result Date: 04/25/2019 CLINICAL DATA:  Chest pain after fall.  Diaphoresis. EXAM: PORTABLE CHEST 1 VIEW COMPARISON:  Radiographs 05/22/2016. FINDINGS: 1514 hours. There are slightly lower lung volumes. There is stable cardiomegaly status post median sternotomy. Aortic atherosclerosis and a spinal stimulator are noted. There is vascular congestion with mild bibasilar atelectasis. No edema, confluent airspace opacity, pleural effusion or pneumothorax. No acute osseous findings. Telemetry leads overlie the chest. IMPRESSION: No acute posttraumatic findings identified. Cardiomegaly with vascular congestion and mild bibasilar atelectasis. Electronically Signed   By: Carey Bullocks M.D.   On: 04/25/2019 15:48      Impression / Plan:   Thomas Watkins is a 73 y.o. y/o male with a history of CAD on Plavix, admitted with severe diarrhea, hypotension, metabolic acidosis, AKI, left sided colitis seen on CT scan of the abdomen . C  diff , GI PCR,COVID negative, Differentials for rectal bleeding include ischemic colitis from hypovolemia due to diuretics and diarrhea vs infectious colitis vs new onset IBD. Of note has a microcytic anemia. At present does not have much out put to indicate profuse diarrhea ( around 150 cc bloody material in bag since 9 am )   Plan  1. Iron studies, B12,folate- replace if low. Monitor CBC and transfuse as needed. ?If has iron deficiency will need outpatient EGD+colonoscopy +/- capsule study of the small bowel  2. IV fluids/Blood to reverse hypovolemia and AKI. Volume resuscitation is the main stay of treatment for ischemic colitis.  3.  Hold plavix if possible 4. IF on day 5 of holding plavix continues to have diarrhea and rectal bleeding then will need flexible sigmoidoscopy and take biopsies to rule out ulcerative colitis.   Thank you for involving me in the care of this patient.      LOS: 1 day   Thomas Mood, MD  04/26/2019, 3:06 PM

## 2019-04-27 LAB — CBC
HCT: 28.9 % — ABNORMAL LOW (ref 39.0–52.0)
Hemoglobin: 8.6 g/dL — ABNORMAL LOW (ref 13.0–17.0)
MCH: 21.7 pg — ABNORMAL LOW (ref 26.0–34.0)
MCHC: 29.8 g/dL — ABNORMAL LOW (ref 30.0–36.0)
MCV: 73 fL — ABNORMAL LOW (ref 80.0–100.0)
Platelets: 205 10*3/uL (ref 150–400)
RBC: 3.96 MIL/uL — ABNORMAL LOW (ref 4.22–5.81)
RDW: 18.7 % — ABNORMAL HIGH (ref 11.5–15.5)
WBC: 14.6 10*3/uL — ABNORMAL HIGH (ref 4.0–10.5)
nRBC: 0 % (ref 0.0–0.2)

## 2019-04-27 LAB — COMPREHENSIVE METABOLIC PANEL
ALT: 17 U/L (ref 0–44)
AST: 24 U/L (ref 15–41)
Albumin: 2.7 g/dL — ABNORMAL LOW (ref 3.5–5.0)
Alkaline Phosphatase: 55 U/L (ref 38–126)
Anion gap: 10 (ref 5–15)
BUN: 29 mg/dL — ABNORMAL HIGH (ref 8–23)
CO2: 27 mmol/L (ref 22–32)
Calcium: 7.3 mg/dL — ABNORMAL LOW (ref 8.9–10.3)
Chloride: 99 mmol/L (ref 98–111)
Creatinine, Ser: 2.06 mg/dL — ABNORMAL HIGH (ref 0.61–1.24)
GFR calc Af Amer: 35 mL/min — ABNORMAL LOW (ref >60)
GFR calc non Af Amer: 31 mL/min — ABNORMAL LOW (ref >60)
Glucose, Bld: 177 mg/dL — ABNORMAL HIGH (ref 70–99)
Potassium: 3.6 mmol/L (ref 3.5–5.1)
Sodium: 136 mmol/L (ref 135–145)
Total Bilirubin: 0.5 mg/dL (ref 0.3–1.2)
Total Protein: 5.6 g/dL — ABNORMAL LOW (ref 6.5–8.1)

## 2019-04-27 LAB — GLUCOSE, CAPILLARY
Glucose-Capillary: 157 mg/dL — ABNORMAL HIGH (ref 70–99)
Glucose-Capillary: 176 mg/dL — ABNORMAL HIGH (ref 70–99)
Glucose-Capillary: 178 mg/dL — ABNORMAL HIGH (ref 70–99)
Glucose-Capillary: 145 mg/dL — ABNORMAL HIGH (ref 70–99)
Glucose-Capillary: 139 mg/dL — ABNORMAL HIGH (ref 70–99)
Glucose-Capillary: 145 mg/dL — ABNORMAL HIGH (ref 70–99)

## 2019-04-27 LAB — PROCALCITONIN: Procalcitonin: 44.34 ng/mL

## 2019-04-27 LAB — VANCOMYCIN, RANDOM: Vancomycin Rm: 10

## 2019-04-27 MED ORDER — VANCOMYCIN HCL 1.5 G IV SOLR
1500.0000 mg | Freq: Once | INTRAVENOUS | Status: AC
  Administered 2019-04-27: 22:00:00 1500 mg via INTRAVENOUS
  Filled 2019-04-27: qty 1500

## 2019-04-27 MED ORDER — CHLORHEXIDINE GLUCONATE 0.12 % MT SOLN
15.0000 mL | Freq: Two times a day (BID) | OROMUCOSAL | Status: DC
  Administered 2019-04-27 – 2019-04-29 (×6): 15 mL via OROMUCOSAL
  Filled 2019-04-27 (×5): qty 15

## 2019-04-27 MED ORDER — SODIUM CHLORIDE 0.9 % IV SOLN
INTRAVENOUS | Status: DC
  Administered 2019-04-27 – 2019-04-28 (×2): via INTRAVENOUS

## 2019-04-27 NOTE — Progress Notes (Signed)
CRITICAL CARE NOTE       SUBJECTIVE FINDINGS & SIGNIFICANT EVENTS   Patient remains critically ill   Continues to have melena.  GI on case follow recommendations.   PAST MEDICAL HISTORY   Past Medical History:  Diagnosis Date  . CAD (coronary artery disease)    a. CABG 2000; b. multiple stents; c. cardiac cath 02/05/2015: occluded mLAD, patent mid to distal LAD after the 100% occlusion with free LIMA to the mid LAD,  patent mid LAD stent w/ mild ISR, patent proximal to mid LAD stent before 100% occlusion, LCx & RCA with mild diffuse disease. EF >55%. No intervention needed. Medical management. CP likely not cardiac.   Marland Kitchen. CHF (congestive heart failure) (HCC)   . Dementia (HCC)   . Diabetes mellitus without complication (HCC)   . Diastolic dysfunction    a. echo 01/2015: EF 60-65%, diastolic dysfunction, mild LVH, mildly dilated LA, mild AS (possibly calcified), mild TR,   . HTN (hypertension)   . OSA (obstructive sleep apnea)      SURGICAL HISTORY   History reviewed. No pertinent surgical history.   FAMILY HISTORY   Family History  Problem Relation Age of Onset  . Heart disease Mother      SOCIAL HISTORY   Social History   Tobacco Use  . Smoking status: Former Smoker  Substance Use Topics  . Alcohol use: Never    Frequency: Never  . Drug use: Never     MEDICATIONS   Current Medication:  Current Facility-Administered Medications:  .  0.9 %  sodium chloride infusion, , Intravenous, Continuous, Kolluru, Sarath, MD .  acetaminophen (TYLENOL) tablet 650 mg, 650 mg, Oral, Q6H PRN **OR** acetaminophen (TYLENOL) suppository 650 mg, 650 mg, Rectal, Q6H PRN, Sainani, Rolly PancakeVivek J, MD .  chlorhexidine (PERIDEX) 0.12 % solution 15 mL, 15 mL, Mouth Rinse, BID, Sherryll BurgerShah, Vipul, MD, 15 mL at 04/27/19 1136 .   Chlorhexidine Gluconate Cloth 2 % PADS 6 each, 6 each, Topical, Daily, Eugenie NorrieBlakeney, Dana G, NP, 6 each at 04/26/19 1400 .  ciprofloxacin (CIPRO) IVPB 400 mg, 400 mg, Intravenous, Q12H, Sainani, Vivek J, MD, Last Rate: 200 mL/hr at 04/27/19 1139, 400 mg at 04/27/19 1139 .  insulin aspart (novoLOG) injection 0-9 Units, 0-9 Units, Subcutaneous, Q4H, Eugenie NorrieBlakeney, Dana G, NP, 2 Units at 04/27/19 [phone] .  metroNIDAZOLE (FLAGYL) IVPB 500 mg, 500 mg, Intravenous, Q8H, Sainani, Rolly PancakeVivek J, MD, Last Rate: 100 mL/hr at 04/27/19 0805, 500 mg at 04/27/19 0805 .  morphine 2 MG/ML injection 1-2 mg, 1-2 mg, Intravenous, Q4H PRN, Eugenie NorrieBlakeney, Dana G, NP, 2 mg at 04/26/19 2035 .  mupirocin ointment (BACTROBAN) 2 % 1 application, 1 application, Nasal, BID, Eugenie NorrieBlakeney, Dana G, NP, 1 application at 04/26/19 2155 .  norepinephrine (LEVOPHED) 4mg  in 250mL premix infusion, 0-40 mcg/min, Intravenous, Continuous, Sainani, Rolly PancakeVivek J, MD, Stopped at 04/26/19 [phone] .  ondansetron (ZOFRAN) tablet 4 mg, 4 mg, Oral, Q6H PRN **OR** ondansetron (ZOFRAN) injection 4 mg, 4 mg, Intravenous, Q6H PRN, Houston SirenSainani, Vivek J, MD, 4 mg at 04/26/19 1553 .  pantoprazole (PROTONIX) injection 40 mg, 40 mg, Intravenous, Q12H, Blakeney, Dana G, NP, 40 mg at 04/27/19 1137 .  vancomycin variable dose per unstable renal function (pharmacist dosing), , Does not apply, See admin instructions, Delfino LovettShah, Vipul, MD    ALLERGIES   Atorvastatin; Duloxetine hcl; Carbamazepine; Lecithin; Oxcarbazepine; Tizanidine hcl; and Yellow dyes (non-tartrazine)    REVIEW OF SYSTEMS    10 system ROS is negative except for abdominal  pain  PHYSICAL EXAMINATION   Vitals:   04/27/19 1000 04/27/19 1100  BP:    Pulse: 99 94  Resp: 10 14  Temp:    SpO2: 91% 100%    GENERAL: Chronically ill-appearing HEAD: Normocephalic, atraumatic.  EYES: Pupils equal, round, reactive to light.  No scleral icterus.  MOUTH: Moist mucosal membrane. NECK: Supple. No thyromegaly. No nodules. No JVD.   PULMONARY: Clear to auscultation bilaterally no wheezing or rhonchorous breath sounds CARDIOVASCULAR: S1 and S2. Regular rate and rhythm. No murmurs, rubs, or gallops.  GASTROINTESTINAL: Soft, nontender, non-distended. No masses. Positive bowel sounds. No hepatosplenomegaly.  MUSCULOSKELETAL: No swelling, clubbing, or edema.  NEUROLOGIC: Mild distress due to acute illness SKIN:intact,warm,dry   LABS AND IMAGING     LAB RESULTS: Recent Labs  Lab 04/26/19 0802 04/26/19 1655 04/27/19 0331  NA 138 135 136  K 6.2* 4.5 3.6  CL 106 102 99  CO2 15* 20* 27  BUN 26* 31* 29*  CREATININE 2.69* 2.50* 2.06*  GLUCOSE 168* 159* 177*   Recent Labs  Lab 04/26/19 1256 04/26/19 1641 04/27/19 0331  HGB 9.0* 9.5* 8.6*  HCT 30.9* 32.7* 28.9*  WBC 22.0* 18.7* 14.6*  PLT 246 243 205     IMAGING RESULTS: No results found.    ASSESSMENT AND PLAN     -Multidisciplinary rounds held today    Septic shock -due to colitis On empiric abx  -now off vasopressors  -follow up cultures -emperic ABX -GI on case - discussed case with Dr Tobi Bastos    Acute blood loss anemia -due to rectal bleeding   -CT chest with bowel wall thickening   - GI on case - discussed with Dr Tobi Bastos appreciate input   - probable ischemic colitis  -transfuse as needed  - possible UC exacerbation - may need flex sigmoidoscopy    Acute kidney Injury stage 2     Nephrology on case - appreciate input - Acute renal failure with hyperkalemia and metabolic acidosis. Baseline creatinine of 1 from 2015.  Acute renal failure from current illness. Lactic acidosis is concerning. - holding lisinopril - Agree with sodium bicarbonate infusion - Nonoliguric: monitor urine output and volume status. - Due to acidosis: will consider dialysis if no improvement.  - Check metformin level. -follow chem 7 -follow UO -continue Foley Catheter-assess need daily   ID -continue IV abx as prescibed -follow up cultures   GI/Nutrition GI PROPHYLAXIS as indicated DIET-->TF's as tolerated Constipation protocol as indicated  ENDO - ICU hypoglycemic\Hyperglycemia protocol -check FSBS per protocol   ELECTROLYTES -follow labs as needed -replace as needed -pharmacy consultation   DVT/GI PRX ordered -SCDs  TRANSFUSIONS AS NEEDED MONITOR FSBS ASSESS the need for LABS as needed   Critical care provider statement:   Critical care time (minutes): 34  Critical care time was exclusive of: Separately billable procedures and treating other patients  Critical care was necessary to treat or prevent imminent or life-threatening deterioration of the following conditions:  Acute blood loss anemia due to GI bleed, septic shock, ischemic colitis, ulcerative colitis, acute kidney injury, possible metformin poisoning  Critical care was time spent personally by me on the following activities: Development of treatment plan with patient or surrogate, discussions with consultants, evaluation of patient's response to treatment, examination of patient, obtaining history from patient or surrogate, ordering and performing treatments and interventions, ordering and review of laboratory studies and re-evaluation of patient's condition.  I assumed direction of critical care for this patient from another provider  in my specialty: no    This document was prepared using Dragon voice recognition software and may include unintentional dictation errors.    Vida Rigger, M.D.  Division of Pulmonary & Critical Care Medicine  [facility]

## 2019-04-27 NOTE — Progress Notes (Signed)
[facility]  ROUNDING NOTE   Subjective:   UOP 2050  Creatinine 2.06 (2.5)  Objective:  Vital signs in last 24 hours:  Temp:  [97.6 F (36.4 C)-98.3 F (36.8 C)] 97.6 F (36.4 C) (05/27 0800) Pulse Rate:  [46-104] 94 (05/27 1100) Resp:  [8-38] 14 (05/27 1100) BP: (62-140)/(39-117) 104/57 (05/27 0900) SpO2:  [87 %-100 %] 100 % (05/27 1100) Weight:  [136.5 kg] 136.5 kg (05/26 1527)  Weight change: -1.393 kg Filed Weights   04/25/19 1459 04/25/19 2039 04/26/19 1527  Weight: (!) 137.9 kg (!) 136.5 kg (!) 136.5 kg    Intake/Output: I/O last 3 completed shifts: In: 7575.2 [I.V.:3543.6; IV Piggyback:4031.7] Out: 2450 [Urine:2250; Stool:200]   Intake/Output this shift:  Total I/O In: 293.6 [I.V.:293.6] Out: 505 [Urine:505]  Physical Exam: General: NAD,   Head: Normocephalic, atraumatic. Moist oral mucosal membranes  Eyes: Anicteric, PERRL  Neck: Supple, trachea midline  Lungs:  Clear to auscultation  Heart: Regular rate and rhythm  Abdomen:  +tender to palpation  Extremities:  trace peripheral edema.  Neurologic: Nonfocal, moving all four extremities  Skin: No lesions       Basic Metabolic Panel: Recent Labs  Lab 04/25/19 1502 04/26/19 0131 04/26/19 0802 04/26/19 1655 04/27/19 0331  NA 138 140 138 135 136  K 4.0 6.3* 6.2* 4.5 3.6  CL 103 107 106 102 99  CO2 17* 15* 15* 20* 27  GLUCOSE 238* 145* 168* 159* 177*  BUN 18 23 26* 31* 29*  CREATININE 1.56* 2.36* 2.69* 2.50* 2.06*  CALCIUM 8.9 8.2* 7.7* 7.4* 7.3*  MG  --  2.3  --   --   --     Liver Function Tests: Recent Labs  Lab 04/25/19 1502 04/26/19 0131 04/27/19 0331  AST 38 40 24  ALT ALKPHOS 91 74 55  BILITOT 0.6 0.6 0.5  PROT 7.3 6.3* 5.6*  ALBUMIN 3.7 3.2* 2.7*   Recent Labs  Lab 04/25/19 1502  LIPASE 34   No results for input(s): AMMONIA in the last 168 hours.  CBC: Recent Labs  Lab 04/25/19 1502 04/26/19 0131 04/26/19 1256 04/26/19 1641 04/27/19 0331   WBC 17.0* 39.2* 22.0* 18.7* 14.6*  NEUTROABS 9.1* 33.1* 18.0*  --   --   HGB 11.2* 10.3* 9.0* 9.5* 8.6*  HCT 40.4 37.3* 30.9* 32.7* 28.9*  MCV 76.2* 77.2* 75.0* 74.3* 73.0*  PLT 408* 319 246 243 205    Cardiac Enzymes: Recent Labs  Lab 04/25/19 1502  CKTOTAL 233  TROPONINI <0.03    BNP: Invalid input(s): POCBNP  CBG: Recent Labs  Lab 04/26/19 1713 04/26/19 1942 04/26/19 2325 04/27/19 0317 04/27/19 0740  GLUCAP 156* 170* 171* 157* 176*    Microbiology: Results for orders placed or performed during the hospital encounter of 04/25/19  SARS Coronavirus 2 (CEPHEID- Performed in [facility] hospital lab), Hosp Order     Status: None   Collection Time: 04/25/19  3:09 PM  Result Value Ref Range Status   SARS Coronavirus 2 NEGATIVE NEGATIVE Final    Comment: (NOTE) If result is NEGATIVE SARS-CoV-2 target nucleic acids are NOT DETECTED. The SARS-CoV-2 RNA is generally detectable in upper and lower  respiratory specimens during the acute phase of infection. The lowest  concentration of SARS-CoV-2 viral copies this assay can detect is 250  copies / mL. A negative result does not preclude SARS-CoV-2 infection  and should not be used as the sole basis for treatment or other  patient  management decisions.  A negative result may occur with  improper specimen collection / handling, submission of specimen other  than nasopharyngeal swab, presence of viral mutation(s) within the  areas targeted by this assay, and inadequate number of viral copies  (<250 copies / mL). A negative result must be combined with clinical  observations, patient history, and epidemiological information. If result is POSITIVE SARS-CoV-2 target nucleic acids are DETECTED. The SARS-CoV-2 RNA is generally detectable in upper and lower  respiratory specimens dur ing the acute phase of infection.  Positive  results are indicative of active infection with SARS-CoV-2.  Clinical  correlation with patient  history and other diagnostic information is  necessary to determine patient infection status.  Positive results do  not rule out bacterial infection or co-infection with other viruses. If result is PRESUMPTIVE POSTIVE SARS-CoV-2 nucleic acids MAY BE PRESENT.   A presumptive positive result was obtained on the submitted specimen  and confirmed on repeat testing.  While 2019 novel coronavirus  (SARS-CoV-2) nucleic acids may be present in the submitted sample  additional confirmatory testing may be necessary for epidemiological  and / or clinical management purposes  to differentiate between  SARS-CoV-2 and other Sarbecovirus currently known to infect humans.  If clinically indicated additional testing with an alternate test  methodology [phone]([id number]) is advised. The SARS-CoV-2 RNA is generally  detectable in upper and lower respiratory sp ecimens during the acute  phase of infection. The expected result is Negative. Fact Sheet for Patients:  BoilerBrush.com.cyhttps://www.fda.gov/media/136312/download Fact Sheet for Healthcare Providers: https://pope.com/https://www.fda.gov/media/136313/download This test is not yet approved or cleared by the [place] FDA and has been authorized for detection and/or diagnosis of SARS-CoV-2 by FDA under an Emergency Use Authorization (EUA).  This EUA will remain in effect (meaning this test can be used) for the duration of the COVID-19 declaration under Section 564(b)(1) of the Act, 21 U.S.C. section 360bbb-3(b)(1), unless the authorization is terminated or revoked sooner. Performed at [facility], [address] [place], [place] [address]   Blood Culture (routine x 2)     Status: None (Preliminary result)   Collection Time: 04/25/19  4:24 PM  Result Value Ref Range Status   Specimen Description BLOOD BLOOD RIGHT HAND  Final   Special Requests   Final    BOTTLES DRAWN AEROBIC AND ANAEROBIC Blood Culture adequate volume   Culture   Final    NO GROWTH 2 DAYS Performed at  [facility], [address] [place], [place] [address]    Report Status PENDING  Incomplete  Urine culture     Status: None (Preliminary result)   Collection Time: 04/25/19  5:09 PM  Result Value Ref Range Status   Specimen Description   Final    URINE, RANDOM Performed at [facility], [address] [place], [place] [address]    Special Requests   Final    NONE Performed at [facility], [address] [place], [place] [address]    Culture   Final    CULTURE REINCUBATED FOR BETTER GROWTH Performed at [facility], [address] [place], [place] [address]    Report Status PENDING  Incomplete  C difficile quick scan w PCR reflex     Status: None   Collection Time: 04/25/19  5:09 PM  Result Value Ref Range Status   C Diff antigen NEGATIVE NEGATIVE Final   C Diff toxin NEGATIVE NEGATIVE Final   C Diff interpretation No C. difficile detected.  Final    Comment: Performed at [facility], [address] [place], [place] [address]  MRSA PCR Screening     Status: Abnormal   Collection Time: 04/25/19  8:00 PM  Result Value Ref Range Status   MRSA by PCR POSITIVE (A) NEGATIVE Final    Comment:        The GeneXpert MRSA Assay (FDA approved for NASAL specimens only), is one component of a comprehensive MRSA colonization surveillance program. It is not intended to diagnose MRSA infection nor to guide or monitor treatment for MRSA infections. RESULT CALLED TO, READ BACK BY AND VERIFIED WITH: C ADELOWO 04/25/2019 AT 2213 BY [facility] Performed at [facility], [address] [place], [place] [address]   Blood Culture (routine x 2)     Status: None (Preliminary result)   Collection Time: 04/25/19  9:07 PM  Result Value Ref Range Status   Specimen Description BLOOD BLOOD RIGHT HAND  Final   Special Requests   Final    BOTTLES DRAWN AEROBIC AND ANAEROBIC Blood Culture results may not be optimal due to an inadequate  volume of blood received in culture bottles   Culture   Final    NO GROWTH 2 DAYS Performed at [facility], [address] [place], [place] [address]    Report Status PENDING  Incomplete  Gastrointestinal Panel by PCR , Stool     Status: None   Collection Time: 04/25/19  9:56 PM  Result Value Ref Range Status   Campylobacter species NOT DETECTED NOT DETECTED Final   Plesimonas shigelloides NOT DETECTED NOT DETECTED Final   Salmonella species NOT DETECTED NOT DETECTED Final   Yersinia enterocolitica NOT DETECTED NOT DETECTED Final   Vibrio species NOT DETECTED NOT DETECTED Final   Vibrio cholerae NOT DETECTED NOT DETECTED Final   Enteroaggregative E coli (EAEC) NOT DETECTED NOT DETECTED Final   Enteropathogenic E coli (EPEC) NOT DETECTED NOT DETECTED Final   Enterotoxigenic E coli (ETEC) NOT DETECTED NOT DETECTED Final   Shiga like toxin producing E coli (STEC) NOT DETECTED NOT DETECTED Final   Shigella/Enteroinvasive E coli (EIEC) NOT DETECTED NOT DETECTED Final   Cryptosporidium NOT DETECTED NOT DETECTED Final   Cyclospora cayetanensis NOT DETECTED NOT DETECTED Final   Entamoeba histolytica NOT DETECTED NOT DETECTED Final   Giardia lamblia NOT DETECTED NOT DETECTED Final   Adenovirus F40/41 NOT DETECTED NOT DETECTED Final   Astrovirus NOT DETECTED NOT DETECTED Final   Norovirus GI/GII NOT DETECTED NOT DETECTED Final   Rotavirus A NOT DETECTED NOT DETECTED Final   Sapovirus (I, II, IV, and V) NOT DETECTED NOT DETECTED Final    Comment: Performed at [facility], [address] [place], [place] [address]    Coagulation Studies: Recent Labs    04/26/19 1641  LABPROT 17.3*  INR 1.4*    Urinalysis: Recent Labs    04/25/19 1709  COLORURINE AMBER*  LABSPEC 1.024  PHURINE 5.0  GLUCOSEU NEGATIVE  HGBUR MODERATE*  BILIRUBINUR NEGATIVE  KETONESUR NEGATIVE  PROTEINUR 100*  NITRITE NEGATIVE  LEUKOCYTESUR NEGATIVE      Imaging: Ct Abdomen Pelvis Wo  Contrast  Result Date: 04/26/2019 CLINICAL DATA:  Abdominal distension EXAM: CT ABDOMEN AND PELVIS WITHOUT CONTRAST TECHNIQUE: Multidetector CT imaging of the abdomen and pelvis was performed following the standard protocol without IV contrast. COMPARISON:  04/25/2019 FINDINGS: Lower chest: Subsegmental atelectasis. Hepatobiliary: Small gallstone towards the neck of the gallbladder is present. Unremarkable liver. Pancreas: Atrophic.  No mass. Spleen: Unremarkable Adrenals/Urinary Tract: Exophytic small cyst from the posterior left kidney is stable. Adrenal glands are unremarkable. Right kidney is within normal limits. Bladder is decompressed by a Foley catheter. Stomach/Bowel: Stomach is distended with oral contrast. New NG tube is in place with its tip in the body of the stomach. Minimally distended small bowel loops in the lower central abdomen are noted. No clear transition point is identified. Remainder of small bowel and colon are decompressed. There is continued wall thickening of the descending and sigmoid colon with inflammatory changes in the adjacent fat. Stranding in the adjacent fat has become more pronounced. There is no obvious pneumatosis. There is no free intraperitoneal gas. No evidence of abscess formation. There is also stranding surrounding the rectum. Rectal tube has been removed. Vascular/Lymphatic: Atherosclerotic calcifications of the aorta and iliac arteries are mild. There is no abnormal retroperitoneal adenopathy by measurement criteria. Reproductive: Normal prostate. Other: No free fluid.  Small inguinal hernias contain adipose. Musculoskeletal: Lumbosacral fusion hardware is in place with pedicle screws and stabilizing bars from L2 through S1. Neural stimulator device traverses into the posterior central spinal canal at T12-L1. The tip is at T8 and T9. No vertebral compression deformity. IMPRESSION: Inflammatory changes involving the descending colon, sigmoid colon, and rectum have  become more pronounced. There is no evidence of perforation or abscess. Differential diagnosis includes inflammatory change, infection, inflammatory bowel disease, pseudomembranous colitis, and ischemia are not excluded. NG tube tip in the stomach.  Rectal tube removed. Mild small bowel ileus pattern. Electronically Signed   By: Jolaine Click M.D.   On: 04/26/2019 07:40   Ct Abdomen Pelvis Wo Contrast  Result Date: 04/25/2019 CLINICAL DATA:  Hypotension, elevated WBC, diarrhea, abdominal pain EXAM: CT ABDOMEN AND PELVIS WITHOUT CONTRAST TECHNIQUE: Multidetector CT imaging of the abdomen and pelvis was performed following the standard protocol without IV contrast. COMPARISON:  None. FINDINGS: Lower chest: Cardiomegaly and coronary artery calcifications. Hepatobiliary: No focal liver abnormality is seen. Hepatic steatosis. Gallstones. No gallbladder wall thickening, or biliary dilatation. Pancreas: Unremarkable. No pancreatic ductal dilatation or surrounding inflammatory changes. Spleen: Normal in size without focal abnormality. Adrenals/Urinary Tract: Adrenal glands are unremarkable. Kidneys are normal, without renal calculi, focal lesion, or hydronephrosis. Bladder is unremarkable. Stomach/Bowel: Stomach is within normal limits. Appendix appears normal. There is mild thickening of the descending colon, sigmoid colon, and rectum. The proximal colon is fluid-filled and there are dense stool balls in the rectum. Rectal tube is positioned in the rectum. Vascular/Lymphatic: Scattered calcific atherosclerosis. No enlarged abdominal or pelvic lymph nodes. Reproductive: No mass or other abnormality. Other: Small fat containing bilateral inguinal hernias. No abdominopelvic ascites. Musculoskeletal: No acute or significant osseous findings. IMPRESSION: 1. There is mild thickening of the descending colon, sigmoid colon, and rectum. This appearance suggests nonspecific infectious, inflammatory, or ischemic colitis. The  proximal colon is fluid-filled and there are dense stool balls in the rectum. Rectal tube is positioned in the rectum. 2.  Other chronic and incidental findings as detailed above. Electronically Signed   By: Lauralyn Primes M.D.   On: 04/25/2019 18:38   Dg Abd 1 View  Result Date: 04/26/2019 CLINICAL DATA:  NG tube placement EXAM: ABDOMEN - 1 VIEW COMPARISON:  CT from the same day FINDINGS: The tip of the NG tube projects over the gastric body. The bowel gas pattern is nonspecific. IMPRESSION: NG tube projects over the gastric body. Electronically Signed   By: Katherine Mantle M.D.   On: 04/26/2019 03:32  Ct Head Wo Contrast  Result Date: 04/25/2019 CLINICAL DATA:  Status post unwitnessed fall, hypotensive. EXAM: CT HEAD WITHOUT CONTRAST TECHNIQUE: Contiguous axial images were obtained from the base of the skull through the vertex without intravenous contrast. COMPARISON:  Head CT dated 09/30/2014. FINDINGS: Brain: Generalized age related parenchymal volume loss with commensurate dilatation of the ventricles and sulci. Mild chronic small vessel ischemic changes within the periventricular white matter regions bilaterally. Ventricles are stable in size. No mass, hemorrhage, edema or other evidence of acute parenchymal abnormality. No extra-axial hemorrhage. Vascular: Chronic calcified atherosclerotic changes of the large vessels at the skull base. No unexpected hyperdense vessel. Skull: Normal. Negative for fracture or focal lesion. Sinuses/Orbits: No acute finding. Other: None. IMPRESSION: 1. No acute findings. No intracranial mass, hemorrhage or edema. No skull fracture. 2. Atrophy and chronic ischemic changes in the white matter. Electronically Signed   By: Bary Richard M.D.   On: 04/25/2019 15:52   Dg Chest Portable 1 View  Result Date: 04/25/2019 CLINICAL DATA:  Chest pain after fall.  Diaphoresis. EXAM: PORTABLE CHEST 1 VIEW COMPARISON:  Radiographs 05/22/2016. FINDINGS: 1514 hours. There are  slightly lower lung volumes. There is stable cardiomegaly status post median sternotomy. Aortic atherosclerosis and a spinal stimulator are noted. There is vascular congestion with mild bibasilar atelectasis. No edema, confluent airspace opacity, pleural effusion or pneumothorax. No acute osseous findings. Telemetry leads overlie the chest. IMPRESSION: No acute posttraumatic findings identified. Cardiomegaly with vascular congestion and mild bibasilar atelectasis. Electronically Signed   By: Carey Bullocks M.D.   On: 04/25/2019 15:48     Medications:   . sodium chloride    . ciprofloxacin Stopped (04/26/19 2256)  . metronidazole 500 mg (04/27/19 0805)  . norepinephrine (LEVOPHED) Adult infusion Stopped (04/26/19 0731)   . chlorhexidine  15 mL Mouth Rinse BID  . Chlorhexidine Gluconate Cloth  6 each Topical Daily  . insulin aspart  0-9 Units Subcutaneous Q4H  . mupirocin ointment  1 application Nasal BID  . pantoprazole (PROTONIX) IV  40 mg Intravenous Q12H  . vancomycin variable dose per unstable renal function (pharmacist dosing)   Does not apply See admin instructions   acetaminophen **OR** acetaminophen, morphine injection, ondansetron **OR** ondansetron (ZOFRAN) IV  Assessment/ Plan:  Mr. Thomas Watkins is a 73 y.o. white male with diabetes mellitus type II insulin dependent, dementia, hypertension, congestive heart failure, dementia, obesity, coronary artery disease, obstructive sleep apnea, who was admitted to [facility] on 04/25/2019 for GIB and hypotension   1. Acute renal failure with hyperkalemia and metabolic acidosis. Baseline creatinine of 1 from 2015.  Acute renal failure from current illness. Lactic acidosis is concerning.  Nonoliguric, lactic acid level improving. No indication for dialysis.  - holding lisinopril - Continue sodium bicarbonate infusion  2. Anemia: with acute blood loss from GI bleed.  Appreciate GI and surgery input.  3. Sepsis: with leukocytosis.  Off vasopressors. Empiric ciprofloxacin, metronidazole and vanco. Cultures pending.   4. Diabetes mellitus type II: insulin dependent - hold metformin    LOS: 2 Bora Bost 5/27/202011:23 AM

## 2019-04-27 NOTE — Progress Notes (Signed)
[employer] - [place] [facility]   PATIENT NAME: Doylene CanardHerman Signorelli    MR#:  [id number]  DATE OF BIRTH:  12/09/1945  SUBJECTIVE:  CHIEF COMPLAINT:   Chief Complaint  Patient presents with  . Fall  Hemoglobin 8.6, has some left thigh pain and was uncomfortable earlier today REVIEW OF SYSTEMS:  Review of Systems  Constitutional: Negative for diaphoresis, fever, malaise/fatigue and weight loss.  HENT: Negative for ear discharge, ear pain, hearing loss, nosebleeds, sore throat and tinnitus.   Eyes: Negative for blurred vision and pain.  Respiratory: Negative for cough, hemoptysis, shortness of breath and wheezing.   Cardiovascular: Negative for chest pain, palpitations, orthopnea and leg swelling.  Gastrointestinal: Positive for abdominal pain. Negative for blood in stool, constipation, diarrhea, heartburn, nausea and vomiting.  Genitourinary: Negative for dysuria, frequency and urgency.  Musculoskeletal: Negative for back pain and myalgias.  Skin: Negative for itching and rash.  Neurological: Negative for dizziness, tingling, tremors, focal weakness, seizures, weakness and headaches.  Psychiatric/Behavioral: Negative for depression. The patient is not nervous/anxious.    DRUG ALLERGIES:   Allergies  Allergen Reactions  . Atorvastatin Other (See Comments)    Joint swelling  . Duloxetine Hcl Rash  . Carbamazepine   . Lecithin   . Oxcarbazepine   . Tizanidine Hcl   . Yellow Dyes (Non-Tartrazine)     Yellow dye #5 specifically   VITALS:  Blood pressure (!) 134/116, pulse 91, temperature 98.2 F (36.8 C), temperature source Oral, resp. rate (!) 8, height 6' (1.829 m), weight (!) 136.5 kg, SpO2 97 %. PHYSICAL EXAMINATION:  Physical Exam HENT:     Head: Normocephalic and atraumatic.  Eyes:     Conjunctiva/sclera: Conjunctivae normal.     Pupils: Pupils are equal, round, and reactive to light.  Neck:     Musculoskeletal: Normal range of motion and neck  supple.     Thyroid: No thyromegaly.     Trachea: No tracheal deviation.  Cardiovascular:     Rate and Rhythm: Normal rate and regular rhythm.     Heart sounds: Normal heart sounds.  Pulmonary:     Effort: Pulmonary effort is normal. No respiratory distress.     Breath sounds: Normal breath sounds. No wheezing.  Chest:     Chest wall: No tenderness.  Abdominal:     General: Bowel sounds are normal. There is no distension.     Palpations: Abdomen is soft.     Tenderness: There is no abdominal tenderness.     Comments: Has rectal tube in place with blood in it  Musculoskeletal: Normal range of motion.  Skin:    General: Skin is warm and dry.     Findings: No rash.  Neurological:     Mental Status: He is alert and oriented to person, place, and time.     Cranial Nerves: No cranial nerve deficit.    LABORATORY PANEL:  Male CBC Recent Labs  Lab 04/27/19 0331  WBC 14.6*  HGB 8.6*  HCT 28.9*  PLT 205   ------------------------------------------------------------------------------------------------------------------ Chemistries  Recent Labs  Lab 04/26/19 0131  04/27/19 0331  NA 140   < > 136  K 6.3*   < > 3.6  CL 107   < > 99  CO2 15*   < > 27  GLUCOSE 145*   < > 177*  BUN 23   < > 29*  CREATININE 2.36*   < > 2.06*  CALCIUM 8.2*   < > 7.3*  MG 2.3  --   --   AST 40  --  24  ALT 21  --  17  ALKPHOS 74  --  55  BILITOT 0.6  --  0.5   < > = values in this interval not displayed.   RADIOLOGY:  No results found. ASSESSMENT AND PLAN:  73 y.o. male with a known history of obesity, dementia, diabetes, history of coronary disease status post bypass, obstructive sleep apnea, chronic diastolic dysfunction who presents to the hospital from a assisted living secondary to fall and noted to be significantly hypotensive with systolic blood pressures in the 80s.  1.  Shock- suspected to be either septic versus hypovolemic shock. - Patient not having significant diarrhea here in  the hospital  2.  Rectal bleeding: Could be due to colitis (Ischemic?) -continue Cipro Flagyl for now given patient's CT abdomen pelvis - suggestive of colitis questionable infectious versus inflammatory.  - Replace low iron with IV iron :  iron deficiency - outpatient EGD+colonoscopy +/- capsule study of the small bowelper GI -Suspected source of patient's shock.  Patient stool for C. difficile is negative, also neg comprehensive culture.  3.  Leukocytosis-secondary to colitis? -monitor with IV antibiotic therapy.  4.  Diabetes type 2 with neuropathy- sliding scale insulin.  Follow blood sugars.  5.  Diabetic neuropathy- resume oral gabapentin when patient can take p.o.  6.  History of atrial fibrillation-rate controlled.  Hold patient's Toprol given the relative hypotension. -Also hold Xarelto given the fact that patient's stools are heme positive for now. Hold plavix  7.  History of coronary disease-patient has no acute chest pain. - hold Plavix due to Heme + stools.    8. Hyperlipidemia - cont. Crestor.   9. Essential HTN -hold all patient's antihypertensives given the severe hypotension and shock.  10. Acute renal failure with hyperkalemia and metabolic acidosis. Baseline creatinine of 1 from 2015.  Urine improving from 2.36->2.06 Acute renal failure from current illness. Lactic acidosis + - hold lisinopril -May need dialysis if no improvement -Nephrology following  11. Acute blood loss anemia: with GI bleed.  Appreciate GI and surgery input.     All the records are reviewed and case discussed with Care Management/Social Worker. Management plans discussed with the patient, intensivist and they are in agreement.  CODE STATUS: DNR  TOTAL TIME TAKING CARE OF THIS PATIENT: 15 minutes.   More than 50% of the time was spent in counseling/coordination of care: YES  POSSIBLE D/C IN 3-4 DAYS, DEPENDING ON CLINICAL CONDITION.   Delfino Lovett M.D on 04/27/2019 at 5:05  PM  Between 7am to 6pm - Pager - [phone]  After 6pm go to [email or web address] - Social research officer, government  [facility]  Office  [phone]  CC: Primary care physician; [facility]  Note: This dictation was prepared with Dragon dictation along with smaller phrase technology. Any transcriptional errors that result from this process are unintentional.

## 2019-04-27 NOTE — Progress Notes (Signed)
Thomas Watkins , MD [address], [place], [place], [address] [address], [address], [place], [place], [address] Phone: [phone]  Fax: [phone]   Thomas Watkins is being followed for Ischemic colitis  Day 1 of follow up   Subjective: Still having some rectal bleeding    Objective: Vital signs in last 24 hours: Vitals:   04/27/19 0600 04/27/19 0700 04/27/19 0800 04/27/19 0900  BP: (!) 88/76 104/89 (!) 106/56 (!) 104/57  Pulse: 90 (!) 103 (!) 104 99  Resp: 20 (!) 34 19 (!) 8  Temp:   97.6 F (36.4 C)   TempSrc:   Oral   SpO2: 92% (!) 87% 90% 95%  Weight:      Height:       Weight change: -1.393 kg  Intake/Output Summary (Last 24 hours) at 04/27/2019 1102 Last data filed at 04/27/2019 0930 Gross per 24 hour  Intake 2908.71 ml  Output 2755 ml  Net 153.71 ml     Exam: Heart:: Regular rate and rhythm, S1S2 present or without murmur or extra heart sounds Lungs: normal, clear to auscultation and clear to auscultation and percussion Abdomen: soft, nontender, normal bowel sounds   Lab Results: CBC Latest Ref Rng & Units 04/27/2019 04/26/2019 04/26/2019  WBC 4.0 - 10.5 K/uL 14.6(H) 18.7(H) 22.0(H)  Hemoglobin 13.0 - 17.0 g/dL 5.7(Q) 4.6(N) 6.2(X)  Hematocrit 39.0 - 52.0 % 28.9(L) 32.7(L) 30.9(L)  Platelets 150 - 400 K/uL 205 243 246   BMP Latest Ref Rng & Units 04/27/2019 04/26/2019 04/26/2019  Glucose 70 - 99 mg/dL 528(U) 132(G) 401(U)  BUN 8 - 23 mg/dL 27(O) 53(G) 64(Q)  Creatinine 0.61 - 1.24 mg/dL 0.34(V) 4.25(Z) 5.63(O)  Sodium 135 - 145 mmol/L 136 135 138  Potassium 3.5 - 5.1 mmol/L 3.6 4.5 6.2(H)  Chloride 98 - 111 mmol/L 99 102 106  CO2 22 - 32 mmol/L 27 20(L) 15(L)  Calcium 8.9 - 10.3 mg/dL 7.3(L) 7.4(L) 7.7(L)   .cbc Micro Results: Recent Results (from the past 240 hour(s))  SARS Coronavirus 2 (CEPHEID- Performed in [facility] hospital lab), Hosp Order     Status: None   Collection Time: 04/25/19  3:09 PM  Result Value Ref Range  Status   SARS Coronavirus 2 NEGATIVE NEGATIVE Final    Comment: (NOTE) If result is NEGATIVE SARS-CoV-2 target nucleic acids are NOT DETECTED. The SARS-CoV-2 RNA is generally detectable in upper and lower  respiratory specimens during the acute phase of infection. The lowest  concentration of SARS-CoV-2 viral copies this assay can detect is 250  copies / mL. A negative result does not preclude SARS-CoV-2 infection  and should not be used as the sole basis for treatment or other  patient management decisions.  A negative result may occur with  improper specimen collection / handling, submission of specimen other  than nasopharyngeal swab, presence of viral mutation(s) within the  areas targeted by this assay, and inadequate number of viral copies  (<250 copies / mL). A negative result must be combined with clinical  observations, patient history, and epidemiological information. If result is POSITIVE SARS-CoV-2 target nucleic acids are DETECTED. The SARS-CoV-2 RNA is generally detectable in upper and lower  respiratory specimens dur ing the acute phase of infection.  Positive  results are indicative of active infection with SARS-CoV-2.  Clinical  correlation with patient history and other diagnostic information is  necessary to determine patient infection status.  Positive results do  not rule out bacterial infection or  co-infection with other viruses. If result is PRESUMPTIVE POSTIVE SARS-CoV-2 nucleic acids MAY BE PRESENT.   A presumptive positive result was obtained on the submitted specimen  and confirmed on repeat testing.  While 2019 novel coronavirus  (SARS-CoV-2) nucleic acids may be present in the submitted sample  additional confirmatory testing may be necessary for epidemiological  and / or clinical management purposes  to differentiate between  SARS-CoV-2 and other Sarbecovirus currently known to infect humans.  If clinically indicated additional testing with an alternate  test  methodology [phone]([id number]) is advised. The SARS-CoV-2 RNA is generally  detectable in upper and lower respiratory sp ecimens during the acute  phase of infection. The expected result is Negative. Fact Sheet for Patients:  BoilerBrush.com.cyhttps://www.fda.gov/media/136312/download Fact Sheet for Healthcare Providers: https://pope.com/https://www.fda.gov/media/136313/download This test is not yet approved or cleared by the [place] FDA and has been authorized for detection and/or diagnosis of SARS-CoV-2 by FDA under an Emergency Use Authorization (EUA).  This EUA will remain in effect (meaning this test can be used) for the duration of the COVID-19 declaration under Section 564(b)(1) of the Act, 21 U.S.C. section 360bbb-3(b)(1), unless the authorization is terminated or revoked sooner. Performed at [facility], [address] [place], [place] [address]   Blood Culture (routine x 2)     Status: None (Preliminary result)   Collection Time: 04/25/19  4:24 PM  Result Value Ref Range Status   Specimen Description BLOOD BLOOD RIGHT HAND  Final   Special Requests   Final    BOTTLES DRAWN AEROBIC AND ANAEROBIC Blood Culture adequate volume   Culture   Final    NO GROWTH 2 DAYS Performed at [facility], [address] [place], [place] [address]    Report Status PENDING  Incomplete  Urine culture     Status: None (Preliminary result)   Collection Time: 04/25/19  5:09 PM  Result Value Ref Range Status   Specimen Description   Final    URINE, RANDOM Performed at [facility], [address] [place], [place] [address]    Special Requests   Final    NONE Performed at [facility], [address] [place], [place] [address]    Culture   Final    CULTURE REINCUBATED FOR BETTER GROWTH Performed at [facility], [address] [place], [place] [address]    Report Status PENDING  Incomplete  C difficile quick scan w PCR reflex     Status: None   Collection Time:  04/25/19  5:09 PM  Result Value Ref Range Status   C Diff antigen NEGATIVE NEGATIVE Final   C Diff toxin NEGATIVE NEGATIVE Final   C Diff interpretation No C. difficile detected.  Final    Comment: Performed at [facility], [address] [place], [place] [address]  MRSA PCR Screening     Status: Abnormal   Collection Time: 04/25/19  8:00 PM  Result Value Ref Range Status   MRSA by PCR POSITIVE (A) NEGATIVE Final    Comment:        The GeneXpert MRSA Assay (FDA approved for NASAL specimens only), is one component of a comprehensive MRSA colonization surveillance program. It is not intended to diagnose MRSA infection nor to guide or monitor treatment for MRSA infections. RESULT CALLED TO, READ BACK BY AND VERIFIED WITH: C ADELOWO 04/25/2019 AT 2213 BY [facility] Performed at [facility], [address] [place], [place] [address]   Blood Culture (  routine x 2)     Status: None (Preliminary result)   Collection Time: 04/25/19  9:07 PM  Result Value Ref Range Status   Specimen Description BLOOD BLOOD RIGHT HAND  Final   Special Requests   Final    BOTTLES DRAWN AEROBIC AND ANAEROBIC Blood Culture results may not be optimal due to an inadequate volume of blood received in culture bottles   Culture   Final    NO GROWTH 2 DAYS Performed at [facility], [address] [place], [place] [address]    Report Status PENDING  Incomplete  Gastrointestinal Panel by PCR , Stool     Status: None   Collection Time: 04/25/19  9:56 PM  Result Value Ref Range Status   Campylobacter species NOT DETECTED NOT DETECTED Final   Plesimonas shigelloides NOT DETECTED NOT DETECTED Final   Salmonella species NOT DETECTED NOT DETECTED Final   Yersinia enterocolitica NOT DETECTED NOT DETECTED Final   Vibrio species NOT DETECTED NOT DETECTED Final   Vibrio cholerae NOT DETECTED NOT DETECTED Final   Enteroaggregative E coli (EAEC) NOT DETECTED NOT DETECTED Final    Enteropathogenic E coli (EPEC) NOT DETECTED NOT DETECTED Final   Enterotoxigenic E coli (ETEC) NOT DETECTED NOT DETECTED Final   Shiga like toxin producing E coli (STEC) NOT DETECTED NOT DETECTED Final   Shigella/Enteroinvasive E coli (EIEC) NOT DETECTED NOT DETECTED Final   Cryptosporidium NOT DETECTED NOT DETECTED Final   Cyclospora cayetanensis NOT DETECTED NOT DETECTED Final   Entamoeba histolytica NOT DETECTED NOT DETECTED Final   Giardia lamblia NOT DETECTED NOT DETECTED Final   Adenovirus F40/41 NOT DETECTED NOT DETECTED Final   Astrovirus NOT DETECTED NOT DETECTED Final   Norovirus GI/GII NOT DETECTED NOT DETECTED Final   Rotavirus A NOT DETECTED NOT DETECTED Final   Sapovirus (I, II, IV, and V) NOT DETECTED NOT DETECTED Final    Comment: Performed at [facility], [address] [place], [place] [address]   Studies/Results: Ct Abdomen Pelvis Wo Contrast  Result Date: 04/26/2019 CLINICAL DATA:  Abdominal distension EXAM: CT ABDOMEN AND PELVIS WITHOUT CONTRAST TECHNIQUE: Multidetector CT imaging of the abdomen and pelvis was performed following the standard protocol without IV contrast. COMPARISON:  04/25/2019 FINDINGS: Lower chest: Subsegmental atelectasis. Hepatobiliary: Small gallstone towards the neck of the gallbladder is present. Unremarkable liver. Pancreas: Atrophic.  No mass. Spleen: Unremarkable Adrenals/Urinary Tract: Exophytic small cyst from the posterior left kidney is stable. Adrenal glands are unremarkable. Right kidney is within normal limits. Bladder is decompressed by a Foley catheter. Stomach/Bowel: Stomach is distended with oral contrast. New NG tube is in place with its tip in the body of the stomach. Minimally distended small bowel loops in the lower central abdomen are noted. No clear transition point is identified. Remainder of small bowel and colon are decompressed. There is continued wall thickening of the descending and sigmoid colon with inflammatory  changes in the adjacent fat. Stranding in the adjacent fat has become more pronounced. There is no obvious pneumatosis. There is no free intraperitoneal gas. No evidence of abscess formation. There is also stranding surrounding the rectum. Rectal tube has been removed. Vascular/Lymphatic: Atherosclerotic calcifications of the aorta and iliac arteries are mild. There is no abnormal retroperitoneal adenopathy by measurement criteria. Reproductive: Normal prostate. Other: No free fluid.  Small inguinal hernias contain adipose. Musculoskeletal: Lumbosacral fusion hardware is in place with pedicle screws and stabilizing bars from L2 through S1. Neural stimulator device traverses into the posterior  central spinal canal at T12-L1. The tip is at T8 and T9. No vertebral compression deformity. IMPRESSION: Inflammatory changes involving the descending colon, sigmoid colon, and rectum have become more pronounced. There is no evidence of perforation or abscess. Differential diagnosis includes inflammatory change, infection, inflammatory bowel disease, pseudomembranous colitis, and ischemia are not excluded. NG tube tip in the stomach.  Rectal tube removed. Mild small bowel ileus pattern. Electronically Signed   By: Jolaine Click M.D.   On: 04/26/2019 07:40   Ct Abdomen Pelvis Wo Contrast  Result Date: 04/25/2019 CLINICAL DATA:  Hypotension, elevated WBC, diarrhea, abdominal pain EXAM: CT ABDOMEN AND PELVIS WITHOUT CONTRAST TECHNIQUE: Multidetector CT imaging of the abdomen and pelvis was performed following the standard protocol without IV contrast. COMPARISON:  None. FINDINGS: Lower chest: Cardiomegaly and coronary artery calcifications. Hepatobiliary: No focal liver abnormality is seen. Hepatic steatosis. Gallstones. No gallbladder wall thickening, or biliary dilatation. Pancreas: Unremarkable. No pancreatic ductal dilatation or surrounding inflammatory changes. Spleen: Normal in size without focal abnormality.  Adrenals/Urinary Tract: Adrenal glands are unremarkable. Kidneys are normal, without renal calculi, focal lesion, or hydronephrosis. Bladder is unremarkable. Stomach/Bowel: Stomach is within normal limits. Appendix appears normal. There is mild thickening of the descending colon, sigmoid colon, and rectum. The proximal colon is fluid-filled and there are dense stool balls in the rectum. Rectal tube is positioned in the rectum. Vascular/Lymphatic: Scattered calcific atherosclerosis. No enlarged abdominal or pelvic lymph nodes. Reproductive: No mass or other abnormality. Other: Small fat containing bilateral inguinal hernias. No abdominopelvic ascites. Musculoskeletal: No acute or significant osseous findings. IMPRESSION: 1. There is mild thickening of the descending colon, sigmoid colon, and rectum. This appearance suggests nonspecific infectious, inflammatory, or ischemic colitis. The proximal colon is fluid-filled and there are dense stool balls in the rectum. Rectal tube is positioned in the rectum. 2.  Other chronic and incidental findings as detailed above. Electronically Signed   By: Lauralyn Primes M.D.   On: 04/25/2019 18:38   Dg Abd 1 View  Result Date: 04/26/2019 CLINICAL DATA:  NG tube placement EXAM: ABDOMEN - 1 VIEW COMPARISON:  CT from the same day FINDINGS: The tip of the NG tube projects over the gastric body. The bowel gas pattern is nonspecific. IMPRESSION: NG tube projects over the gastric body. Electronically Signed   By: Katherine Mantle M.D.   On: 04/26/2019 03:32   Ct Head Wo Contrast  Result Date: 04/25/2019 CLINICAL DATA:  Status post unwitnessed fall, hypotensive. EXAM: CT HEAD WITHOUT CONTRAST TECHNIQUE: Contiguous axial images were obtained from the base of the skull through the vertex without intravenous contrast. COMPARISON:  Head CT dated 09/30/2014. FINDINGS: Brain: Generalized age related parenchymal volume loss with commensurate dilatation of the ventricles and sulci. Mild  chronic small vessel ischemic changes within the periventricular white matter regions bilaterally. Ventricles are stable in size. No mass, hemorrhage, edema or other evidence of acute parenchymal abnormality. No extra-axial hemorrhage. Vascular: Chronic calcified atherosclerotic changes of the large vessels at the skull base. No unexpected hyperdense vessel. Skull: Normal. Negative for fracture or focal lesion. Sinuses/Orbits: No acute finding. Other: None. IMPRESSION: 1. No acute findings. No intracranial mass, hemorrhage or edema. No skull fracture. 2. Atrophy and chronic ischemic changes in the white matter. Electronically Signed   By: Bary Richard M.D.   On: 04/25/2019 15:52   Dg Chest Portable 1 View  Result Date: 04/25/2019 CLINICAL DATA:  Chest pain after fall.  Diaphoresis. EXAM: PORTABLE CHEST 1 VIEW COMPARISON:  Radiographs 05/22/2016.  FINDINGS: 1514 hours. There are slightly lower lung volumes. There is stable cardiomegaly status post median sternotomy. Aortic atherosclerosis and a spinal stimulator are noted. There is vascular congestion with mild bibasilar atelectasis. No edema, confluent airspace opacity, pleural effusion or pneumothorax. No acute osseous findings. Telemetry leads overlie the chest. IMPRESSION: No acute posttraumatic findings identified. Cardiomegaly with vascular congestion and mild bibasilar atelectasis. Electronically Signed   By: Carey Bullocks M.D.   On: 04/25/2019 15:48   Medications: I have reviewed the patient's current medications. Scheduled Meds:  chlorhexidine  15 mL Mouth Rinse BID   Chlorhexidine Gluconate Cloth  6 each Topical Daily   insulin aspart  0-9 Units Subcutaneous Q4H   mupirocin ointment  1 application Nasal BID   pantoprazole (PROTONIX) IV  40 mg Intravenous Q12H   vancomycin variable dose per unstable renal function (pharmacist dosing)   Does not apply See admin instructions   Continuous Infusions:  sodium chloride     ciprofloxacin  Stopped (04/26/19 2256)   metronidazole 500 mg (04/27/19 0805)   norepinephrine (LEVOPHED) Adult infusion Stopped (04/26/19 0731)   PRN Meds:.acetaminophen **OR** acetaminophen, morphine injection, ondansetron **OR** ondansetron (ZOFRAN) IV   Assessment: Active Problems:   Septic shock (HCC)  Thomas Watkins is a 73 y.o. y/o male with a history of CAD on Plavix, admitted with severe diarrhea, hypotension, metabolic acidosis, AKI, left sided colitis seen on CT scan of the abdomen . C diff , GI PCR,COVID negative, Differentials for rectal bleeding include ischemic colitis from hypovolemia due to diuretics and diarrhea vs infectious colitis vs new onset IBD. Of note has a microcytic iron deficiency  anemia. At present does not have diarrhea but still having some rectal bleeding. Drop in Hb may be related to drop in creatinine with fluids.   Plan  1. Replace low iron with IV iron :  iron deficiency will need outpatient EGD+colonoscopy +/- capsule study of the small bowel  2. IV fluids/Blood to reverse hypovolemia and AKI. Volume resuscitation is the main stay of treatment for ischemic colitis.  3.  Hold plavix if possible 4. IF on day 5 of holding plavix continues to have diarrhea and rectal bleeding then will need flexible sigmoidoscopy and take biopsies to rule out ulcerative colitis.     LOS: 2 days   Thomas Mood, MD 04/27/2019, 11:02 AM

## 2019-04-27 NOTE — Consult Note (Signed)
Pharmacy Antibiotic Note  Thomas Watkins is a 73 y.o. male admitted on 04/25/2019 with sepsis.  Pharmacy has been consulted for vancomycin dosing. Pt is on metronidazole and ciprofloxacin.   Plan: Pt received a vancomycin loading dose of 2500 mg x 1 on 5/25 @1849 . Creatinine is trending up 1.56 to 2.36. Will dose by levels, renal function is unstable.   5/26 - 24 hour level is 8 mcg/ml. Creatinine is continuing to trend upward. Will order Vancomycin 1500mg  IV once and check random level in 24 hours.   5/27 - 24 hour level is 10 mcg/ml. Creatinine level has decreased from 2.50 to 2.06. Will continue Vancomycin 1500mg  IV once and check random level in 24 hours.  Height: 6' (182.9 cm) Weight: (!) 300 lb 14.9 oz (136.5 kg) IBW/kg (Calculated) : 77.6  Temp (24hrs), Avg:98 F (36.7 C), Min:97.6 F (36.4 C), Max:98.2 F (36.8 C)  Recent Labs  Lab 04/25/19 1502 04/25/19 1730 04/25/19 2107 04/26/19 0131 04/26/19 0802 04/26/19 1256 04/26/19 1641 04/26/19 1655 04/27/19 0331 04/27/19 1743  WBC 17.0*  --   --  39.2*  --  22.0* 18.7*  --  14.6*  --   CREATININE 1.56*  --   --  2.36* 2.69*  --   --  2.50* 2.06*  --   LATICACIDVEN  --  10.2* 9.5* 8.9*  --   --  4.5*  --   --   --   VANCORANDOM  --   --   --   --   --   --  8  --   --  10    Estimated Creatinine Clearance: 44.4 mL/min (A) (by C-G formula based on SCr of 2.06 mg/dL (H)).    Allergies  Allergen Reactions  . Atorvastatin Other (See Comments)    Joint swelling  . Duloxetine Hcl Rash  . Carbamazepine   . Lecithin   . Oxcarbazepine   . Tizanidine Hcl   . Yellow Dyes (Non-Tartrazine)     Yellow dye #5 specifically    Antimicrobials this admission: 5/26 vancomycin >>  5/25 cipro >>  5/25 metronidazole >>  Dose adjustments this admission: None  Microbiology results: 5/25 BCx: pending 5/25 UCx: pending  5/25 MRSA PCR: positive 5/25 COVID-19 (-)  Thank you for allowing pharmacy to be a part of this  patient's care.  Clovia Cuff, PharmD, BCPS 04/27/2019 8:04 PM

## 2019-04-28 LAB — CBC WITH DIFFERENTIAL/PLATELET
Abs Immature Granulocytes: 0.02 10*3/uL (ref 0.00–0.07)
Basophils Absolute: 0 10*3/uL (ref 0.0–0.1)
Basophils Relative: 0 %
Eosinophils Absolute: 0.1 10*3/uL (ref 0.0–0.5)
Eosinophils Relative: 1 %
HCT: 28.3 % — ABNORMAL LOW (ref 39.0–52.0)
Hemoglobin: 8.2 g/dL — ABNORMAL LOW (ref 13.0–17.0)
Immature Granulocytes: 0 %
Lymphocytes Relative: 20 %
Lymphs Abs: 2.1 10*3/uL (ref 0.7–4.0)
MCH: 21.3 pg — ABNORMAL LOW (ref 26.0–34.0)
MCHC: 29 g/dL — ABNORMAL LOW (ref 30.0–36.0)
MCV: 73.5 fL — ABNORMAL LOW (ref 80.0–100.0)
Monocytes Absolute: 0.9 10*3/uL (ref 0.1–1.0)
Monocytes Relative: 9 %
Neutro Abs: 7.4 10*3/uL (ref 1.7–7.7)
Neutrophils Relative %: 70 %
Platelets: 201 10*3/uL (ref 150–400)
RBC: 3.85 MIL/uL — ABNORMAL LOW (ref 4.22–5.81)
RDW: 18.6 % — ABNORMAL HIGH (ref 11.5–15.5)
WBC: 10.5 10*3/uL (ref 4.0–10.5)
nRBC: 0 % (ref 0.0–0.2)

## 2019-04-28 LAB — BASIC METABOLIC PANEL
Anion gap: 6 (ref 5–15)
BUN: 19 mg/dL (ref 8–23)
CO2: 31 mmol/L (ref 22–32)
Calcium: 7.5 mg/dL — ABNORMAL LOW (ref 8.9–10.3)
Chloride: 101 mmol/L (ref 98–111)
Creatinine, Ser: 1.39 mg/dL — ABNORMAL HIGH (ref 0.61–1.24)
GFR calc Af Amer: 57 mL/min — ABNORMAL LOW (ref >60)
GFR calc non Af Amer: 49 mL/min — ABNORMAL LOW (ref >60)
Glucose, Bld: 160 mg/dL — ABNORMAL HIGH (ref 70–99)
Potassium: 3.4 mmol/L — ABNORMAL LOW (ref 3.5–5.1)
Sodium: 138 mmol/L (ref 135–145)

## 2019-04-28 LAB — URINE CULTURE: Culture: >=100000 — AB

## 2019-04-28 LAB — GLUCOSE, CAPILLARY
Glucose-Capillary: 167 mg/dL — ABNORMAL HIGH (ref 70–99)
Glucose-Capillary: 140 mg/dL — ABNORMAL HIGH (ref 70–99)
Glucose-Capillary: 137 mg/dL — ABNORMAL HIGH (ref 70–99)
Glucose-Capillary: 131 mg/dL — ABNORMAL HIGH (ref 70–99)
Glucose-Capillary: 141 mg/dL — ABNORMAL HIGH (ref 70–99)

## 2019-04-28 LAB — INFLAMMATORY BOWEL DISEASE-IBD
Atypical P-ANCA titer: <1:20 {titer}
Saccharomyces cerevisiae, IgA: 23.9 Units (ref 0.0–24.9)
Saccharomyces cerevisiae, IgG: 50.9 Units — ABNORMAL HIGH (ref 0.0–24.9)

## 2019-04-28 MED ORDER — SODIUM CHLORIDE 0.9 % IV SOLN
100.0000 mg | INTRAVENOUS | Status: DC
  Administered 2019-04-28 – 2019-04-29 (×2): 100 mg via INTRAVENOUS
  Filled 2019-04-28 (×3): qty 5

## 2019-04-28 MED ORDER — SODIUM CHLORIDE 0.9 % IV SOLN
3.0000 g | Freq: Four times a day (QID) | INTRAVENOUS | Status: DC
  Administered 2019-04-28 – 2019-04-29 (×5): 3 g via INTRAVENOUS
  Filled 2019-04-28 (×8): qty 3

## 2019-04-28 MED ORDER — SODIUM CHLORIDE 0.9% FLUSH
10.0000 mL | Freq: Two times a day (BID) | INTRAVENOUS | Status: DC
  Administered 2019-04-28: 30 mL
  Administered 2019-04-28: 20 mL
  Administered 2019-04-29: 10 mL
  Administered 2019-04-29: 30 mL

## 2019-04-28 MED ORDER — POTASSIUM CHLORIDE IN NACL 20-0.9 MEQ/L-% IV SOLN
INTRAVENOUS | Status: DC
  Administered 2019-04-28 – 2019-04-29 (×3): via INTRAVENOUS
  Filled 2019-04-28 (×4): qty 1000

## 2019-04-28 MED ORDER — SODIUM CHLORIDE 0.9% FLUSH: 10.0000 mL | INTRAVENOUS | Status: DC | PRN

## 2019-04-28 NOTE — Progress Notes (Signed)
CRITICAL CARE NOTE      SUBJECTIVE FINDINGS & SIGNIFICANT EVENTS   Patient had 2 melanotic stools overnight, he has not had Plavix since admission to MICU today will be day 4 without Plavix, he is on IV fluids with NS plus K at 75 cc/h.  Urine output is improving  Patient remains critically ill Prognosis is guarded   PAST MEDICAL HISTORY   Past Medical History:  Diagnosis Date  . CAD (coronary artery disease)    a. CABG 2000; b. multiple stents; c. cardiac cath 02/05/2015: occluded mLAD, patent mid to distal LAD after the 100% occlusion with free LIMA to the mid LAD,  patent mid LAD stent w/ mild ISR, patent proximal to mid LAD stent before 100% occlusion, LCx & RCA with mild diffuse disease. EF >55%. No intervention needed. Medical management. CP likely not cardiac.   Marland Kitchen CHF (congestive heart failure) (HCC)   . Dementia (HCC)   . Diabetes mellitus without complication (HCC)   . Diastolic dysfunction    a. echo 01/2015: EF 60-65%, diastolic dysfunction, mild LVH, mildly dilated LA, mild AS (possibly calcified), mild TR,   . HTN (hypertension)   . OSA (obstructive sleep apnea)      SURGICAL HISTORY   History reviewed. No pertinent surgical history.   FAMILY HISTORY   Family History  Problem Relation Age of Onset  . Heart disease Mother      SOCIAL HISTORY   Social History   Tobacco Use  . Smoking status: Former Smoker  Substance Use Topics  . Alcohol use: Never    Frequency: Never  . Drug use: Never     MEDICATIONS   Current Medication:  Current Facility-Administered Medications:  .  0.9 %  sodium chloride infusion, , Intravenous, Continuous, Kolluru, Sarath, MD, Last Rate: 75 mL/hr at 04/28/19 0600 .  chlorhexidine (PERIDEX) 0.12 % solution 15 mL, 15 mL, Mouth Rinse, BID, Delfino Lovett,  MD, 15 mL at 04/27/19 2212 .  Chlorhexidine Gluconate Cloth 2 % PADS 6 each, 6 each, Topical, Daily, Eugenie Norrie, NP, 6 each at 04/27/19 1309 .  ciprofloxacin (CIPRO) IVPB 400 mg, 400 mg, Intravenous, Q12H, Houston Siren, MD, Stopped at 04/27/19 2351 .  insulin aspart (novoLOG) injection 0-9 Units, 0-9 Units, Subcutaneous, Q4H, Eugenie Norrie, NP, 2 Units at 04/28/19 0425 .  metroNIDAZOLE (FLAGYL) IVPB 500 mg, 500 mg, Intravenous, Q8H, Sainani, Vivek J, MD, Last Rate: 100 mL/hr at 04/27/19 2351, 500 mg at 04/27/19 2351 .  morphine 2 MG/ML injection 1-2 mg, 1-2 mg, Intravenous, Q4H PRN, Eugenie Norrie, NP, 2 mg at 04/27/19 2216 .  mupirocin ointment (BACTROBAN) 2 % 1 application, 1 application, Nasal, BID, Eugenie Norrie, NP, 1 application at 04/27/19 2212 .  norepinephrine (LEVOPHED) 4mg  in premix infusion, 0-40 mcg/min, Intravenous, Continuous, Sainani, Rolly Pancake, MD, Stopped at 04/26/19 [phone] .  ondansetron (ZOFRAN) tablet 4 mg, 4 mg, Oral, Q6H PRN **OR** ondansetron (ZOFRAN) injection 4 mg, 4 mg, Intravenous, Q6H PRN, Houston Siren, MD, 4 mg at 04/26/19 1553 .  pantoprazole (PROTONIX) injection 40 mg, 40 mg, Intravenous, Q12H, Blakeney, Neldon Newport, NP, 40 mg at 04/27/19 2212 .  vancomycin variable dose per unstable renal function (pharmacist dosing), , Does not apply, See admin instructions, Delfino Lovett, MD    ALLERGIES   Atorvastatin; Duloxetine hcl; Carbamazepine; Lecithin; Oxcarbazepine; Tizanidine hcl; and Yellow dyes (non-tartrazine)    REVIEW OF SYSTEMS    10 point ROS conducted is negative  except for thirst.  PHYSICAL EXAMINATION   Vitals:   04/28/19 0500 04/28/19 0600  BP: (!) 145/54 (!) 126/51  Pulse: 85 80  Resp: 20 (!) 35  Temp:    SpO2: 99% 95%    GENERAL: Chronically ill-appearing with mild abdominal discomfort HEAD: Normocephalic, atraumatic.  EYES: Pupils equal, round, reactive to light.  No scleral icterus.  MOUTH: Moist mucosal membrane.  NECK: Supple. No thyromegaly. No nodules. No JVD.  PULMONARY: Crackles at the bases bilaterally no rhonchorous breath sounds or wheezing CARDIOVASCULAR: S1 and S2. Regular rate and rhythm. No murmurs, rubs, or gallops.  GASTROINTESTINAL: Soft, nontender, non-distended. No masses. Positive bowel sounds. No hepatosplenomegaly.  MUSCULOSKELETAL: No swelling, clubbing, or edema.  NEUROLOGIC: Mild distress due to acute illness SKIN:intact,warm,dry   LABS AND IMAGING     LAB RESULTS: Recent Labs  Lab 04/26/19 1655 04/27/19 0331 04/28/19 0306  NA 135 136 138  K 4.5 3.6 3.4*  CL 102 99 101  CO2 20* 27 31  BUN 31* 29* 19  CREATININE 2.50* 2.06* 1.39*  GLUCOSE 159* 177* 160*   Recent Labs  Lab 04/26/19 1641 04/27/19 0331 04/28/19 0306  HGB 9.5* 8.6* 8.2*  HCT 32.7* 28.9* 28.3*  WBC 18.7* 14.6* 10.5  PLT 243 205 201     IMAGING RESULTS: No results found.    ASSESSMENT AND PLAN    -Multidisciplinary rounds held today    Septic shock-due to colitis On empiric abx -now offvasopressors  -follow up cultures -emperic ABX -GI on case - discussed case with Dr Tobi Bastos  -Clinically improved   Acuteblood loss anemia -due to rectal bleeding -CT chest with bowel wall thickening - GI on case - discussed with Dr Tobi Bastos appreciate input - probable ischemic colitis  -transfuse as needed  - possible UC exacerbation - may need flex sigmoidoscopy   -Today is day 4 without Plavix    Severe microcytic anemia -Status post GI evaluation-appreciate input-likely iron deficiency -Initiating Venofer IV 100 daily   Acute kidney Injury stage 2  Nephrology on case - appreciate input -Acute renal failure with hyperkalemia and metabolic acidosis. Baseline creatinine of 1 from 2015.  Acute renal failure from current illness. Lactic acidosis is concerning. - holding lisinopril - Agree with sodium bicarbonate infusion - Nonoliguric: monitor urine output and  volume status. - Due to acidosis: will consider dialysis if no improvement.  - Check metformin level. -follow chem 7 -follow UO -continue Foley Catheter-assess need daily   ID -continue IV abx as prescibed -follow up cultures  GI/Nutrition GI PROPHYLAXIS as indicated DIET-->TF's as tolerated Constipation protocol as indicated  ENDO - ICU hypoglycemic\Hyperglycemia protocol -check FSBS per protocol   ELECTROLYTES -follow labs as needed -replace as needed -pharmacy consultation   DVT/GI PRX ordered -SCDs  TRANSFUSIONS AS NEEDED MONITOR FSBS ASSESS the need for LABS as needed   Critical care provider statement:  Critical care time (minutes):32 Critical care time was exclusive of: Separately billable procedures and treating other patients Critical care was necessary to treat or prevent imminent or life-threatening deterioration of the following conditions:Acute blood loss anemia due to GI bleed, septic shock, ischemic colitis, ulcerative colitis, acute kidney injury, possible metformin poisoning Critical care was time spent personally by me on the following activities: Development of treatment plan with patient or surrogate, discussions with consultants, evaluation of patient's response to treatment, examination of patient, obtaining history from patient or surrogate, ordering and performing treatments and interventions, ordering and review of laboratory studies and re-evaluation of  patient's condition. I assumed direction of critical care for this patient from another provider in my specialty: no    This document was prepared using Dragon voice recognition software and may include unintentional dictation errors.    Vida RiggerFuad Bronwen Pendergraft, M.D.  Division of Pulmonary & Critical Care Medicine  [facility]

## 2019-04-28 NOTE — Progress Notes (Signed)
Wyline Mood , MD [address], [place], [place], [address] [address], [address], [place], [place], [address] Phone: [phone]  Fax: [phone]   Thomas Watkins is being followed for Ischemic colitis    Subjective: No abdominal pain , feels much better, says minimal rectal bleeding    Objective: Vital signs in last 24 hours: Vitals:   04/28/19 0500 04/28/19 0600 04/28/19 0700 04/28/19 0800  BP: (!) 145/54 (!) 126/51 125/62 121/74  Pulse: 85 80 90 88  Resp: 20 (!) 35 20 16  Temp:    98.3 F (36.8 C)  TempSrc:    Oral  SpO2: 99% 95% 95% 98%  Weight:      Height:       Weight change:   Intake/Output Summary (Last 24 hours) at 04/28/2019 1018 Last data filed at 04/28/2019 0600 Gross per 24 hour  Intake 1166.8 ml  Output 2650 ml  Net -1483.2 ml     Exam: Heart:: Regular rate and rhythm, S1S2 present or without murmur or extra heart sounds Lungs: normal, clear to auscultation and clear to auscultation and percussion Abdomen: soft, nontender, normal bowel sounds   Lab Results: @LABTEST2 @ Micro Results: Recent Results (from the past 240 hour(s))  SARS Coronavirus 2 (CEPHEID- Performed in [facility] hospital lab), Hosp Order     Status: None   Collection Time: 04/25/19  3:09 PM  Result Value Ref Range Status   SARS Coronavirus 2 NEGATIVE NEGATIVE Final    Comment: (NOTE) If result is NEGATIVE SARS-CoV-2 target nucleic acids are NOT DETECTED. The SARS-CoV-2 RNA is generally detectable in upper and lower  respiratory specimens during the acute phase of infection. The lowest  concentration of SARS-CoV-2 viral copies this assay can detect is 250  copies / mL. A negative result does not preclude SARS-CoV-2 infection  and should not be used as the sole basis for treatment or other  patient management decisions.  A negative result may occur with  improper specimen collection / handling, submission of specimen other  than nasopharyngeal swab,  presence of viral mutation(s) within the  areas targeted by this assay, and inadequate number of viral copies  (<250 copies / mL). A negative result must be combined with clinical  observations, patient history, and epidemiological information. If result is POSITIVE SARS-CoV-2 target nucleic acids are DETECTED. The SARS-CoV-2 RNA is generally detectable in upper and lower  respiratory specimens dur ing the acute phase of infection.  Positive  results are indicative of active infection with SARS-CoV-2.  Clinical  correlation with patient history and other diagnostic information is  necessary to determine patient infection status.  Positive results do  not rule out bacterial infection or co-infection with other viruses. If result is PRESUMPTIVE POSTIVE SARS-CoV-2 nucleic acids MAY BE PRESENT.   A presumptive positive result was obtained on the submitted specimen  and confirmed on repeat testing.  While 2019 novel coronavirus  (SARS-CoV-2) nucleic acids may be present in the submitted sample  additional confirmatory testing may be necessary for epidemiological  and / or clinical management purposes  to differentiate between  SARS-CoV-2 and other Sarbecovirus currently known to infect humans.  If clinically indicated additional testing with an alternate test  methodology [id number]) is advised. The SARS-CoV-2 RNA is generally  detectable in upper and lower respiratory sp ecimens during the acute  phase of infection. The expected result is Negative. Fact Sheet for Patients:  BoilerBrush.com.cy Fact Sheet for Healthcare Providers: https://pope.com/ This test is  not yet approved or cleared by the [place] and has been authorized for detection and/or diagnosis of SARS-CoV-2 by FDA under an Emergency Use Authorization (EUA).  This EUA will remain in effect (meaning this test can be used) for the duration of the COVID-19 declaration under  Section 564(b)(1) of the Act, 21 U.S.C. section 360bbb-3(b)(1), unless the authorization is terminated or revoked sooner. Performed at [facility], [address] [place], [place] [address]   Blood Culture (routine x 2)     Status: None (Preliminary result)   Collection Time: 04/25/19  4:24 PM  Result Value Ref Range Status   Specimen Description BLOOD BLOOD RIGHT HAND  Final   Special Requests   Final    BOTTLES DRAWN AEROBIC AND ANAEROBIC Blood Culture adequate volume   Culture   Final    NO GROWTH 3 DAYS Performed at [facility], [address], [place], [place] [address]    Report Status PENDING  Incomplete  Urine culture     Status: Abnormal (Preliminary result)   Collection Time: 04/25/19  5:09 PM  Result Value Ref Range Status   Specimen Description   Final    URINE, RANDOM Performed at [facility], [address] [place], [place] [address]    Special Requests   Final    NONE Performed at [facility], [address] [place], [place] [address]    Culture (A)  Final    >=100,000 COLONIES/mL ENTEROCOCCUS FAECALIS SUSCEPTIBILITIES TO FOLLOW Performed at [facility], [address], [place], [place] [address]    Report Status PENDING  Incomplete  C difficile quick scan w PCR reflex     Status: None   Collection Time: 04/25/19  5:09 PM  Result Value Ref Range Status   C Diff antigen NEGATIVE NEGATIVE Final   C Diff toxin NEGATIVE NEGATIVE Final   C Diff interpretation No C. difficile detected.  Final    Comment: Performed at [facility], [address] [place], [place] [address]  MRSA PCR Screening     Status: Abnormal   Collection Time: 04/25/19  8:00 PM  Result Value Ref Range Status   MRSA by PCR POSITIVE (A) NEGATIVE Final    Comment:        The GeneXpert MRSA Assay (FDA approved for NASAL specimens only), is one component of a comprehensive MRSA colonization surveillance program. It is not  intended to diagnose MRSA infection nor to guide or monitor treatment for MRSA infections. RESULT CALLED TO, READ BACK BY AND VERIFIED WITH: C ADELOWO 04/25/2019 AT 2213 BY [facility] Performed at [facility], [address] [place], [place] [address]   Blood Culture (routine x 2)     Status: None (Preliminary result)   Collection Time: 04/25/19  9:07 PM  Result Value Ref Range Status   Specimen Description BLOOD BLOOD RIGHT HAND  Final   Special Requests   Final    BOTTLES DRAWN AEROBIC AND ANAEROBIC Blood Culture results may not be optimal due to an inadequate volume of blood received in culture bottles   Culture   Final    NO GROWTH 3 DAYS Performed at [facility], [address], [place], [place] [address]    Report Status PENDING  Incomplete  Gastrointestinal Panel by PCR , Stool     Status: None   Collection Time: 04/25/19  9:56 PM  Result Value Ref Range Status   Campylobacter species NOT  DETECTED NOT DETECTED Final   Plesimonas shigelloides NOT DETECTED NOT DETECTED Final   Salmonella species NOT DETECTED NOT DETECTED Final   Yersinia enterocolitica NOT DETECTED NOT DETECTED Final   Vibrio species NOT DETECTED NOT DETECTED Final   Vibrio cholerae NOT DETECTED NOT DETECTED Final   Enteroaggregative E coli (EAEC) NOT DETECTED NOT DETECTED Final   Enteropathogenic E coli (EPEC) NOT DETECTED NOT DETECTED Final   Enterotoxigenic E coli (ETEC) NOT DETECTED NOT DETECTED Final   Shiga like toxin producing E coli (STEC) NOT DETECTED NOT DETECTED Final   Shigella/Enteroinvasive E coli (EIEC) NOT DETECTED NOT DETECTED Final   Cryptosporidium NOT DETECTED NOT DETECTED Final   Cyclospora cayetanensis NOT DETECTED NOT DETECTED Final   Entamoeba histolytica NOT DETECTED NOT DETECTED Final   Giardia lamblia NOT DETECTED NOT DETECTED Final   Adenovirus F40/41 NOT DETECTED NOT DETECTED Final   Astrovirus NOT DETECTED NOT DETECTED Final   Norovirus GI/GII NOT  DETECTED NOT DETECTED Final   Rotavirus A NOT DETECTED NOT DETECTED Final   Sapovirus (I, II, IV, and V) NOT DETECTED NOT DETECTED Final    Comment: Performed at [facility], [address] [place], [place] [address]   Studies/Results: No results found. Medications: I have reviewed the patient's current medications. Scheduled Meds: . chlorhexidine  15 mL Mouth Rinse BID  . Chlorhexidine Gluconate Cloth  6 each Topical Daily  . insulin aspart  0-9 Units Subcutaneous Q4H  . mupirocin ointment  1 application Nasal BID  . pantoprazole (PROTONIX) IV  40 mg Intravenous Q12H  . sodium chloride flush  10-40 mL Intracatheter Q12H  . vancomycin variable dose per unstable renal function (pharmacist dosing)   Does not apply See admin instructions   Continuous Infusions: . 0.9 % NaCl with KCl 20 mEq / L    . ciprofloxacin Stopped (04/27/19 2351)  . metronidazole 500 mg (04/28/19 0914)  . norepinephrine (LEVOPHED) Adult infusion Stopped (04/26/19 0731)   PRN Meds:.morphine injection, ondansetron **OR** ondansetron (ZOFRAN) IV, sodium chloride flush   Assessment: Active Problems:   Septic shock (HCC)   Thomas Watkins a 73 y.o.y/o malewith a history of CAD on Plavix, admitted with severe diarrhea, hypotension, metabolic acidosis, AKI, left sided colitis seen on CT scan of the abdomen . C diff ,GI PCR,COVID negative, Differentials for rectal bleeding include ischemic colitis from hypovolemia due to diuretics and diarrhea vs infectious colitis vs new onset IBD. Of note has a microcytic iron deficiency  anemia.At present does not have diarrhea but still having some rectal bleeding. Drop in Hb may be related to drop in creatinine with fluids.   Plan  1. Replace low iron with IV iron :  iron deficiency will need outpatient EGD+colonoscopy +/- capsule study of the small bowel 2. IV fluids/Blood to reverse hypovolemia and AKI. Volume resuscitation is the main stay of  treatment for ischemic colitis. 3. Hold plavix if possible 4. IF on day 5 of holding plavix continues to have diarrhea and rectal bleeding then will need flexible sigmoidoscopy and take biopsies to rule out ulcerative colitis.  Wyline MoodKiran Naasia Weilbacher, MD 04/28/2019, 10:18 AM

## 2019-04-28 NOTE — Progress Notes (Signed)
[employer] [facility] [place] [facility]   PATIENT NAME: Tyrec Wride    MR#:  [id number]  DATE OF BIRTH:  05/04/1946  SUBJECTIVE:  CHIEF COMPLAINT:   Chief Complaint  Patient presents with  . Fall  Patient is doing well today, no diarrhea, good urine output .   REVIEW OF SYSTEMS:  Review of Systems  Constitutional: Negative for diaphoresis, fever, malaise/fatigue and weight loss.  HENT: Negative for ear discharge, ear pain, hearing loss, nosebleeds, sore throat and tinnitus.   Eyes: Negative for blurred vision and pain.  Respiratory: Negative for cough, hemoptysis, shortness of breath and wheezing.   Cardiovascular: Negative for chest pain, palpitations, orthopnea and leg swelling.  Gastrointestinal: Positive for abdominal pain. Negative for blood in stool, constipation, diarrhea, heartburn, nausea and vomiting.  Genitourinary: Negative for dysuria, frequency and urgency.  Musculoskeletal: Negative for back pain and myalgias.  Skin: Negative for itching and rash.  Neurological: Negative for dizziness, tingling, tremors, focal weakness, seizures, weakness and headaches.  Psychiatric/Behavioral: Negative for depression. The patient is not nervous/anxious.    DRUG ALLERGIES:   Allergies  Allergen Reactions  . Atorvastatin Other (See Comments)    Joint swelling  . Duloxetine Hcl Rash  . Carbamazepine   . Lecithin   . Oxcarbazepine   . Tizanidine Hcl   . Yellow Dyes (Non-Tartrazine)     Yellow dye #5 specifically   VITALS:  Blood pressure 121/74, pulse 88, temperature 98.3 F (36.8 C), temperature source Oral, resp. rate 16, height 6' (1.829 m), weight (!) 136.5 kg, SpO2 98 %. PHYSICAL EXAMINATION:  Physical Exam HENT:     Head: Normocephalic and atraumatic.  Eyes:     Conjunctiva/sclera: Conjunctivae normal.     Pupils: Pupils are equal, round, and reactive to light.  Neck:     Musculoskeletal: Normal range of motion and neck supple.     Thyroid: No  thyromegaly.     Trachea: No tracheal deviation.  Cardiovascular:     Rate and Rhythm: Normal rate and regular rhythm.     Heart sounds: Normal heart sounds.  Pulmonary:     Effort: Pulmonary effort is normal. No respiratory distress.     Breath sounds: Normal breath sounds. No wheezing.  Chest:     Chest wall: No tenderness.  Abdominal:     General: Bowel sounds are normal. There is no distension.     Palpations: Abdomen is soft.     Tenderness: There is no abdominal tenderness.     Comments: Has rectal tube in place with blood in it  Musculoskeletal: Normal range of motion.  Skin:    General: Skin is warm and dry.     Findings: No rash.  Neurological:     Mental Status: He is alert and oriented to person, place, and time.     Cranial Nerves: No cranial nerve deficit.    LABORATORY PANEL:  Male CBC Recent Labs  Lab 04/28/19 0306  WBC 10.5  HGB 8.2*  HCT 28.3*  PLT 201   ------------------------------------------------------------------------------------------------------------------ Chemistries  Recent Labs  Lab 04/26/19 0131  04/27/19 0331 04/28/19 0306  NA 140   < > 136 138  K 6.3*   < > 3.6 3.4*  CL 107   < > 99 101  CO2 15*   < > 27 31  GLUCOSE 145*   < > 177* 160*  BUN 23   < > 29* 19  CREATININE 2.36*   < > 2.06* 1.39*  CALCIUM 8.2*   < > 7.3* 7.5*  MG 2.3  --   --   --   AST 40  --  24  --   ALT 21  --  17  --   ALKPHOS 74  --  55  --   BILITOT 0.6  --  0.5  --    < > = values in this interval not displayed.   RADIOLOGY:  No results found. ASSESSMENT AND PLAN:  73 y.o. male with a known history of obesity, dementia, diabetes, history of coronary disease status post bypass, obstructive sleep apnea, chronic diastolic dysfunction who presents to the hospital from a assisted living secondary to fall and noted to be significantly hypotensive with systolic blood pressures in the 80s.  1.  S septic shock present on admission secondary to UTI, urine  cultures are showing Enterococcus faecalis, had elevated lactic acid, procalcitonin,  2.  Rectal bleeding: Could be due to colitis (Ischemic?) -continue Cipro Flagyl for now given patient's CT abdomen pelvis - suggestive of colitis questionable infectious versus inflammatory.   - Replace low iron with IV iron :  iron deficiency - outpatient EGD+colonoscopy +/- capsule study of the small bowelper GI -Suspected source of patient's shock.  Patient stool for C. difficile is negative, also neg comprehensive culture.  3.  Leukocytosis-secondary to colitis? Improved.  4.  Diabetes type 2 with neuropathy- stable with sliding scale insulin with coverage.  5.  Diabetic neuropathy- clear liquids today.  6.  History of atrial fibrillation-rate controlled.  -Also hold Xarelto given the fact that patient's stools are heme positive for now. Hold plavix,, watch closely.  Start Toprol-XL at lower dose from tomorrow.  7.  History of coronary disease-patient has no acute chest pain. - hold Plavix due to Heme + stools.    8. Hyperlipidemia - cont. Crestor.   9. Essential HTN -hypotension before but now improved.  Still soft so continue to hold BP medicines for now.   10. Acute renal failure with hyperkalemia and metabolic acidosis. Baseline creatinine of 1 from 2015.  Urine improving from 2.36->2.06-1.39.  Nephrology is following. Acute renal failure from current illness. Lactic acidosis + - hold lisinopril -Function improving.-Nephrology following  11. Acute blood loss anemia: with GI bleed.  Appreciate GI and surgery input.     All the records are reviewed and case discussed with Care Management/Social Worker. Management plans discussed with the patient, intensivist and they are in agreement.  CODE STATUS: DNR  TOTAL TIME TAKING CARE OF THIS PATIENT: 15 minutes.   More than 50% of the time was spent in counseling/coordination of care: YES  POSSIBLE D/C IN 3-4 DAYS, DEPENDING ON  CLINICAL CONDITION.   Katha HammingSnehalatha Loman Logan M.D on 04/28/2019 at 11:57 AM  Between 7am to 6pm - Pager - [phone]  After 6pm go to [email or web address] - Social research officer, governmentpassword [email or web address] [email or web address]  [facility]  Office  [phone]  CC: Primary care physician; [facility]  Note: This dictation was prepared with Dragon dictation along with smaller phrase technology. Any transcriptional errors that result from this process are unintentional.

## 2019-04-28 NOTE — Progress Notes (Signed)
[facility]  ROUNDING NOTE   Subjective:   UOP 3155 ( 2050)  Creatinine 1.39 (2.06) (2.5)  No more bloody stools reported this morning.   Objective:  Vital signs in last 24 hours:  Temp:  [97.8 F (36.6 C)-98.5 F (36.9 C)] 98.3 F (36.8 C) (05/28 0800) Pulse Rate:  [80-100] 88 (05/28 0800) Resp:  [8-35] 16 (05/28 0800) BP: (101-147)/(49-116) 121/74 (05/28 0800) SpO2:  [65 %-100 %] 98 % (05/28 0800)  Weight change:  Filed Weights   04/25/19 1459 04/25/19 2039 04/26/19 1527  Weight: (!) 137.9 kg (!) 136.5 kg (!) 136.5 kg    Intake/Output: I/O last 3 completed shifts: In: 3426.1 [I.V.:2513.6; IV Piggyback:912.6] Out: 4655 [Urine:4655]   Intake/Output this shift:  No intake/output data recorded.  Physical Exam: General: NAD,   Head: Normocephalic, atraumatic. Moist oral mucosal membranes  Eyes: Anicteric, PERRL  Neck: Supple, trachea midline  Lungs:  Clear to auscultation  Heart: Regular rate and rhythm  Abdomen:  +tender to palpation  Extremities:  trace peripheral edema.  Neurologic: Nonfocal, moving all four extremities  Skin: No lesions       Basic Metabolic Panel: Recent Labs  Lab 04/26/19 0131 04/26/19 0802 04/26/19 1655 04/27/19 0331 04/28/19 0306  NA 140 138 135 136 138  K 6.3* 6.2* 4.5 3.6 3.4*  CL 107 106 102 99 101  CO2 15* 15* 20* 27 31  GLUCOSE 145* 168* 159* 177* 160*  BUN 23 26* 31* 29* 19  CREATININE 2.36* 2.69* 2.50* 2.06* 1.39*  CALCIUM 8.2* 7.7* 7.4* 7.3* 7.5*  MG 2.3  --   --   --   --     Liver Function Tests: Recent Labs  Lab 04/25/19 1502 04/26/19 0131 04/27/19 0331  AST 38 40 24  ALT 21 21 17   ALKPHOS 91 74 55  BILITOT 0.6 0.6 0.5  PROT 7.3 6.3* 5.6*  ALBUMIN 3.7 3.2* 2.7*   Recent Labs  Lab 04/25/19 1502  LIPASE 34   No results for input(s): AMMONIA in the last 168 hours.  CBC: Recent Labs  Lab 04/25/19 1502 04/26/19 0131 04/26/19 1256 04/26/19 1641 04/27/19 0331 04/28/19 0306  WBC  17.0* 39.2* 22.0* 18.7* 14.6* 10.5  NEUTROABS 9.1* 33.1* 18.0*  --   --  7.4  HGB 11.2* 10.3* 9.0* 9.5* 8.6* 8.2*  HCT 40.4 37.3* 30.9* 32.7* 28.9* 28.3*  MCV 76.2* 77.2* 75.0* 74.3* 73.0* 73.5*  PLT 408* 319 246 243 205 201    Cardiac Enzymes: Recent Labs  Lab 04/25/19 1502  CKTOTAL 233  TROPONINI <0.03    BNP: Invalid input(s): POCBNP  CBG: Recent Labs  Lab 04/27/19 1638 04/27/19 1943 04/27/19 2327 04/28/19 0344 04/28/19 0812  GLUCAP 145* 139* 145* 167* 140*    Microbiology: Results for orders placed or performed during the hospital encounter of 04/25/19  SARS Coronavirus 2 (CEPHEID- Performed in [facility] hospital lab), Hosp Order     Status: None   Collection Time: 04/25/19  3:09 PM  Result Value Ref Range Status   SARS Coronavirus 2 NEGATIVE NEGATIVE Final    Comment: (NOTE) If result is NEGATIVE SARS-CoV-2 target nucleic acids are NOT DETECTED. The SARS-CoV-2 RNA is generally detectable in upper and lower  respiratory specimens during the acute phase of infection. The lowest  concentration of SARS-CoV-2 viral copies this assay can detect is 250  copies / mL. A negative result does not preclude SARS-CoV-2 infection  and should not be used as the sole basis  for treatment or other  patient management decisions.  A negative result may occur with  improper specimen collection / handling, submission of specimen other  than nasopharyngeal swab, presence of viral mutation(s) within the  areas targeted by this assay, and inadequate number of viral copies  (<250 copies / mL). A negative result must be combined with clinical  observations, patient history, and epidemiological information. If result is POSITIVE SARS-CoV-2 target nucleic acids are DETECTED. The SARS-CoV-2 RNA is generally detectable in upper and lower  respiratory specimens dur ing the acute phase of infection.  Positive  results are indicative of active infection with SARS-CoV-2.  Clinical   correlation with patient history and other diagnostic information is  necessary to determine patient infection status.  Positive results do  not rule out bacterial infection or co-infection with other viruses. If result is PRESUMPTIVE POSTIVE SARS-CoV-2 nucleic acids MAY BE PRESENT.   A presumptive positive result was obtained on the submitted specimen  and confirmed on repeat testing.  While 2019 novel coronavirus  (SARS-CoV-2) nucleic acids may be present in the submitted sample  additional confirmatory testing may be necessary for epidemiological  and / or clinical management purposes  to differentiate between  SARS-CoV-2 and other Sarbecovirus currently known to infect humans.  If clinically indicated additional testing with an alternate test  methodology [phone]) is advised. The SARS-CoV-2 RNA is generally  detectable in upper and lower respiratory sp ecimens during the acute  phase of infection. The expected result is Negative. Fact Sheet for Patients:  BoilerBrush.com.cy Fact Sheet for Healthcare Providers: https://pope.com/ This test is not yet approved or cleared by the [place] FDA and has been authorized for detection and/or diagnosis of SARS-CoV-2 by FDA under an Emergency Use Authorization (EUA).  This EUA will remain in effect (meaning this test can be used) for the duration of the COVID-19 declaration under Section 564(b)(1) of the Act, 21 U.S.C. section 360bbb-3(b)(1), unless the authorization is terminated or revoked sooner. Performed at [facility], [address] [place], [place] [address]   Blood Culture (routine x 2)     Status: None (Preliminary result)   Collection Time: 04/25/19  4:24 PM  Result Value Ref Range Status   Specimen Description BLOOD BLOOD RIGHT HAND  Final   Special Requests   Final    BOTTLES DRAWN AEROBIC AND ANAEROBIC Blood Culture adequate volume   Culture   Final    NO  GROWTH 3 DAYS Performed at [facility], [address] [place], [place] [address]    Report Status PENDING  Incomplete  Urine culture     Status: Abnormal (Preliminary result)   Collection Time: 04/25/19  5:09 PM  Result Value Ref Range Status   Specimen Description   Final    URINE, RANDOM Performed at [facility], [address], [place], [place] [address]    Special Requests   Final    NONE Performed at [facility], [address] [place], [place] [address]    Culture (A)  Final    >=100,000 COLONIES/mL ENTEROCOCCUS FAECALIS SUSCEPTIBILITIES TO FOLLOW Performed at [facility], [address] [place], [place] [address]    Report Status PENDING  Incomplete  C difficile quick scan w PCR reflex     Status: None   Collection Time: 04/25/19  5:09 PM  Result Value Ref Range Status   C Diff antigen NEGATIVE NEGATIVE Final   C Diff toxin NEGATIVE NEGATIVE Final  C Diff interpretation No C. difficile detected.  Final    Comment: Performed at [facility], [address] [place], [place] [address]  MRSA PCR Screening     Status: Abnormal   Collection Time: 04/25/19  8:00 PM  Result Value Ref Range Status   MRSA by PCR POSITIVE (A) NEGATIVE Final    Comment:        The GeneXpert MRSA Assay (FDA approved for NASAL specimens only), is one component of a comprehensive MRSA colonization surveillance program. It is not intended to diagnose MRSA infection nor to guide or monitor treatment for MRSA infections. RESULT CALLED TO, READ BACK BY AND VERIFIED WITH: C ADELOWO 04/25/2019 AT 2213 BY [facility] Performed at [facility], [address] [place], [place] [address]   Blood Culture (routine x 2)     Status: None (Preliminary result)   Collection Time: 04/25/19  9:07 PM  Result Value Ref Range Status   Specimen Description BLOOD BLOOD RIGHT HAND  Final   Special Requests   Final    BOTTLES DRAWN AEROBIC AND  ANAEROBIC Blood Culture results may not be optimal due to an inadequate volume of blood received in culture bottles   Culture   Final    NO GROWTH 3 DAYS Performed at [facility], [address] [place], [place] [address]    Report Status PENDING  Incomplete  Gastrointestinal Panel by PCR , Stool     Status: None   Collection Time: 04/25/19  9:56 PM  Result Value Ref Range Status   Campylobacter species NOT DETECTED NOT DETECTED Final   Plesimonas shigelloides NOT DETECTED NOT DETECTED Final   Salmonella species NOT DETECTED NOT DETECTED Final   Yersinia enterocolitica NOT DETECTED NOT DETECTED Final   Vibrio species NOT DETECTED NOT DETECTED Final   Vibrio cholerae NOT DETECTED NOT DETECTED Final   Enteroaggregative E coli (EAEC) NOT DETECTED NOT DETECTED Final   Enteropathogenic E coli (EPEC) NOT DETECTED NOT DETECTED Final   Enterotoxigenic E coli (ETEC) NOT DETECTED NOT DETECTED Final   Shiga like toxin producing E coli (STEC) NOT DETECTED NOT DETECTED Final   Shigella/Enteroinvasive E coli (EIEC) NOT DETECTED NOT DETECTED Final   Cryptosporidium NOT DETECTED NOT DETECTED Final   Cyclospora cayetanensis NOT DETECTED NOT DETECTED Final   Entamoeba histolytica NOT DETECTED NOT DETECTED Final   Giardia lamblia NOT DETECTED NOT DETECTED Final   Adenovirus F40/41 NOT DETECTED NOT DETECTED Final   Astrovirus NOT DETECTED NOT DETECTED Final   Norovirus GI/GII NOT DETECTED NOT DETECTED Final   Rotavirus A NOT DETECTED NOT DETECTED Final   Sapovirus (I, II, IV, and V) NOT DETECTED NOT DETECTED Final    Comment: Performed at [facility], [address] [place], [place] [address]    Coagulation Studies: Recent Labs    04/26/19 1641  LABPROT 17.3*  INR 1.4*    Urinalysis: Recent Labs    04/25/19 1709  COLORURINE AMBER*  LABSPEC 1.024  PHURINE 5.0  GLUCOSEU NEGATIVE  HGBUR MODERATE*  BILIRUBINUR NEGATIVE  KETONESUR NEGATIVE  PROTEINUR 100*  NITRITE  NEGATIVE  LEUKOCYTESUR NEGATIVE      Imaging: No results found.   Medications:   . sodium chloride 75 mL/hr at 04/28/19 0600  . ciprofloxacin Stopped (04/27/19 2351)  . metronidazole 500 mg (04/28/19 0914)  . norepinephrine (LEVOPHED) Adult infusion Stopped (04/26/19 0731)   . chlorhexidine  15 mL Mouth Rinse BID  . Chlorhexidine Gluconate Cloth  6  each Topical Daily  . insulin aspart  0-9 Units Subcutaneous Q4H  . mupirocin ointment  1 application Nasal BID  . pantoprazole (PROTONIX) IV  40 mg Intravenous Q12H  . vancomycin variable dose per unstable renal function (pharmacist dosing)   Does not apply See admin instructions   morphine injection, ondansetron **OR** ondansetron (ZOFRAN) IV  Assessment/ Plan:  Mr. Thomas Watkins is a 73 y.o. white male with diabetes mellitus type II insulin dependent, dementia, hypertension, congestive heart failure, dementia, obesity, coronary artery disease, obstructive sleep apnea, who was admitted to [facility] on 04/25/2019 for GIB and hypotension   1. Acute renal failure with hyperkalemia and metabolic acidosis. Baseline creatinine of 1 from 2015. Now with hypokalemia Acute renal failure from current illness. Lactic acidosis is concerning.  Nonoliguric, lactic acid level improving. No indication for dialysis.  Off maintenance fluids.  - holding lisinopril - PICC/midline is acceptable.  - add potassium to NS infusion  2. Anemia: with acute blood loss from GI bleed. Hemoglobin stable 8.2 Appreciate GI and surgery input.  3. Sepsis: urinary tract infection source.  with leukocytosis. Off vasopressors. Empiric ciprofloxacin, metronidazole and vanco. Cultures with e. Faecalis in urine  4. Diabetes mellitus type II: insulin dependent - hold metformin    LOS: 3 [place] Weight 5/28/20209:28 AM

## 2019-04-28 NOTE — Progress Notes (Signed)
Arrived at room 126, re checked patient's midline and the midline flushes and pulls back blood fine. The IV fluids (potassium/normal saline) were infusing fine. The iron infusion was beeping occluded. I checked all tubing to make sure none was clamped. I also checked for air bubbles in the line. None were present. I restarted the infusion, and made the RN Marchelle Folks aware of the situation.

## 2019-04-28 NOTE — TOC Progression Note (Signed)
Transition of Care [facility]) - Progression Note    Patient Details  Name: Thomas Watkins MRN: [id number] Date of Birth: [birth year]  Transition of Care [facility]) CM/SW Contact  Allayne Butcher, RN Phone Number: 04/28/2019, 11:10 AM  Clinical Narrative:    Patient is stable for transfer to the [facility]- request for transfer consent and medical information faxed to [phone].  H&P and progress notes sent in separate fax.  Patient is improving and has floor care orders.     Expected Discharge Plan: Home w Home Health Services(From [facility]) Barriers to Discharge: Continued Medical Work up  Expected Discharge Plan and Services Expected Discharge Plan: Home w Home Health Services(From [facility])       Living arrangements for the past 2 months: Assisted Living Facility                                       Social Determinants of Health (SDOH) Interventions    Readmission Risk Interventions No flowsheet data found.

## 2019-04-28 NOTE — TOC Progression Note (Addendum)
Transition of Care [facility]) - Progression Note    Patient Details  Name: Thomas Watkins MRN: [id number] Date of Birth: [birth year]  [facility]) CM/SW Contact  Allayne Butcher, RN Phone Number: 04/28/2019, 3:40 PM  Clinical Narrative:    [facility] has declined the patient at this time.  Patient is waiting on a floor bed but still in the ICU, [facility]  TOC team can attempt to transfer patient again once he gets a floor bed.  Called wife to update her on [place] transfer, call went to voicemail, message left.    Expected Discharge Plan: Home w Home Health Services(From [facility]) Barriers to Discharge: Continued Medical Work up  Expected Discharge Plan and Services Expected Discharge Plan: Home w Home Health Services(From [facility])       Living arrangements for the past 2 months: Assisted Living Facility                                       Social Determinants of Health (SDOH) Interventions    Readmission Risk Interventions No flowsheet data found.

## 2019-04-29 ENCOUNTER — Ambulatory Visit (HOSPITAL_COMMUNITY)
Admission: AD | Admit: 2019-04-29 | Discharge: 2019-04-29 | Disposition: A | Discharge status: Home / Self Care | Payer: Medicare Other | Source: Other Acute Inpatient Hospital | Attending: Internal Medicine | Admitting: Internal Medicine

## 2019-04-29 DIAGNOSIS — A419 Sepsis, unspecified organism: Secondary | ICD-10-CM | POA: Insufficient documentation

## 2019-04-29 LAB — CBC WITH DIFFERENTIAL/PLATELET
Abs Immature Granulocytes: 0.06 10*3/uL (ref 0.00–0.07)
Basophils Absolute: 0 10*3/uL (ref 0.0–0.1)
Basophils Relative: 0 %
Eosinophils Absolute: 0.1 10*3/uL (ref 0.0–0.5)
Eosinophils Relative: 2 %
HCT: 31.6 % — ABNORMAL LOW (ref 39.0–52.0)
Hemoglobin: 8.9 g/dL — ABNORMAL LOW (ref 13.0–17.0)
Immature Granulocytes: 1 %
Lymphocytes Relative: 25 %
Lymphs Abs: 2 10*3/uL (ref 0.7–4.0)
MCH: 21.6 pg — ABNORMAL LOW (ref 26.0–34.0)
MCHC: 28.2 g/dL — ABNORMAL LOW (ref 30.0–36.0)
MCV: 76.7 fL — ABNORMAL LOW (ref 80.0–100.0)
Monocytes Absolute: 0.8 10*3/uL (ref 0.1–1.0)
Monocytes Relative: 10 %
Neutro Abs: 5.1 10*3/uL (ref 1.7–7.7)
Neutrophils Relative %: 62 %
Platelets: 234 10*3/uL (ref 150–400)
RBC: 4.12 MIL/uL — ABNORMAL LOW (ref 4.22–5.81)
RDW: 18.4 % — ABNORMAL HIGH (ref 11.5–15.5)
WBC: 8.1 10*3/uL (ref 4.0–10.5)
nRBC: 0 % (ref 0.0–0.2)

## 2019-04-29 LAB — BASIC METABOLIC PANEL
Anion gap: 9 (ref 5–15)
BUN: 14 mg/dL (ref 8–23)
CO2: 29 mmol/L (ref 22–32)
Calcium: 8.1 mg/dL — ABNORMAL LOW (ref 8.9–10.3)
Chloride: 103 mmol/L (ref 98–111)
Creatinine, Ser: 0.99 mg/dL (ref 0.61–1.24)
GFR calc Af Amer: >60 mL/min (ref >60)
GFR calc non Af Amer: >60 mL/min (ref >60)
Glucose, Bld: 136 mg/dL — ABNORMAL HIGH (ref 70–99)
Potassium: 3.8 mmol/L (ref 3.5–5.1)
Sodium: 141 mmol/L (ref 135–145)

## 2019-04-29 LAB — GLUCOSE, CAPILLARY
Glucose-Capillary: 102 mg/dL — ABNORMAL HIGH (ref 70–99)
Glucose-Capillary: 117 mg/dL — ABNORMAL HIGH (ref 70–99)
Glucose-Capillary: 118 mg/dL — ABNORMAL HIGH (ref 70–99)
Glucose-Capillary: 120 mg/dL — ABNORMAL HIGH (ref 70–99)
Glucose-Capillary: 121 mg/dL — ABNORMAL HIGH (ref 70–99)
Glucose-Capillary: 154 mg/dL — ABNORMAL HIGH (ref 70–99)

## 2019-04-29 MED ORDER — METOPROLOL SUCCINATE ER 25 MG PO TB24
25.0000 mg | ORAL_TABLET | Freq: Every day | ORAL | Status: DC
  Administered 2019-04-29: 25 mg via ORAL
  Filled 2019-04-29: qty 1

## 2019-04-29 MED ORDER — MUPIROCIN 2 % EX OINT: 1.0000 "application " | TOPICAL_OINTMENT | Freq: Two times a day (BID) | CUTANEOUS | 0 refills | Status: AC

## 2019-04-29 MED ORDER — METOPROLOL SUCCINATE ER 25 MG PO TB24: 25.0000 mg | ORAL_TABLET | Freq: Every day | ORAL | 0 refills | Status: AC

## 2019-04-29 NOTE — Discharge Summary (Signed)
Thomas Watkins, is a 73 y.o. male  DOB 03-05-1946  MRN [id number].  Admission date:  04/25/2019  Admitting Physician  Houston Siren, MD  Discharge Date:  04/29/2019   [facility]  [facility]  Recommendations for primary care physician for things to follow:  Transfer to [facility] when the arrangements are made.  Patient will be transferred to the [facility] when the bed is available, accepting physician Dr. Christell Constant   Admission Diagnosis  Fall, initial encounter [W19.XXXA] Hypotension, unspecified hypotension type [I95.9] Diarrhea, unspecified type [R19.7]   Discharge Diagnosis  Fall, initial encounter [W19.XXXA] Hypotension, unspecified hypotension type [I95.9] Diarrhea, unspecified type [R19.7]    Active Problems:   Septic shock [facility])      Past Medical History:  Diagnosis Date  . CAD (coronary artery disease)    a. CABG 2000; b. multiple stents; c. cardiac cath 02/05/2015: occluded mLAD, patent mid to distal LAD after the 100% occlusion with free LIMA to the mid LAD,  patent mid LAD stent w/ mild ISR, patent proximal to mid LAD stent before 100% occlusion, LCx & RCA with mild diffuse disease. EF >55%. No intervention needed. Medical management. CP likely not cardiac.   Marland Kitchen CHF (congestive heart failure) (HCC)   . Dementia (HCC)   . Diabetes mellitus without complication (HCC)   . Diastolic dysfunction    a. echo 01/2015: EF 60-65%, diastolic dysfunction, mild LVH, mildly dilated LA, mild AS (possibly calcified), mild TR,   . HTN (hypertension)   . OSA (obstructive sleep apnea)     History reviewed. No pertinent surgical history.     History of present illness and  Hospital Course:     Kindly see H&P for history of present illness and admission details, please review complete Labs, Consult  reports and Test reports for all details in brief  HPI  from the history and physical done on the day of admission 73 year old male patient history of CAD, CABG, diabetes mellitus type 2, diastolic heart failure, obstructive sleep apnea, multiple stent to the heart comes in because of altered mental status and found to have  hypotension diarrhea, admitted to ICU because of hypotension and diarrhea and possible hypovolemic shock.   Hospital Course  #1/hypovolemic, septic shock present on admission, patient had significant diarrhea with the volume loss, admitted to intensive care unit, resuscitated with IV fluids, broad-spectrum antibiotics with Cipro, Flagyl.  Patient had diarrhea so stool for C. difficile, ova and parasites, GI panel was sent and it is negative.  Patient hypotension improved, 2.  Abdominal pain, diarrhea, possible ischemic/infectious colitis, seen by gastroenterology, patient had diarrhea so initially had rectal tube that was taken out, CT abdomen pelvis showed colitis, seen by gastroenterology, received Cipro, Flagyl, thought to have ischemic rash/infectious colitis, gastroenterology plan to do EGD, colonoscopy as an outpatient or inpatient when he is off anticoagulation for 5 days.  Patient is not bleeding rapidly from GI so patient is followed by gastroenterology closely.  Today is day 5 of not getting the Plavix.  Patient Plavix on hold for 5 days.  Patient rectal bleed likely secondary from ischemic colitis from hypovolemia due to diuretics that she was taking at home and also possibly new onset IBD.  Patient stable hemodynamically, hemoglobin stable and never required transfusion. 2.  Septic shock secondary to enterococcal UTI, improved with antibiotics, initially received broad-spectrum antibiotics in the ER, narrowed down to Cipro Flagyl for abdominal source, later on found to have  enterococcal UTI, now getting Unasyn, patient white count as high as 39.2, patient lactic acid was  10.2 but decreased to 8.9, patient also had elevated procalcitonin up to 40, today patient has no fever, WBC also normalized to 8.1, continue Unasyn for enterococcal UTI to cover the UTI UTI and also abdominal source. 3.  Acute kidney injury secondary to septic shock, improved with fluids, holding diuretics, ACE inhibitor's. 4.  Acute blood loss anemia due to GI bleed, never required blood transfusion, holding anticoagulation of Plavix, Xarelto. 5.  Essential hypertension, patient was hypotensive before, now BP improved, restarted beta-blocker at lower dose but continue to hold ACE inhibitor's. 6.  Diabetes mellitus type 2, because of sepsis, lactic acidosis metformin was not given, continue sliding scale insulin with coverage, Lantus also is not given because patient is n.p.o. status.  #7 history of CAD, CABG, PCI, patient is on Ranexa, aspirin, statins, Plavix, Xarelto, can continue statin, small dose beta-blocker but continue to hold aspirin, Plavix, statins.   [facility], patient condition discussed with third-year resident Mr.Lee.  Patient is accepted  At [facility]. Discharge Condition: stable   Follow UP      Discharge Instructions  and  Discharge Medications  Continue IV fluids, IV antibiotics, insulin sliding scale as per MAR.    Allergies as of 04/29/2019      Reactions   Atorvastatin Other (See Comments)   Joint swelling   Duloxetine Hcl Rash   Carbamazepine    Lecithin    Oxcarbazepine    Tizanidine Hcl    Yellow Dyes (non-tartrazine)    Yellow dye #5 specifically      Medication List    STOP taking these medications   acetaminophen 325 MG tablet Commonly known as:  TYLENOL   alum & mag hydroxide-simeth 400-400-40 MG/5ML suspension Commonly known as:  MAALOX PLUS   cetirizine 10 MG tablet Commonly known as:  ZYRTEC   clopidogrel 75 MG tablet Commonly known as:  PLAVIX   Colace 100 MG capsule Generic drug:   docusate sodium   Dextran 70-Hypromellose 0.1-0.3 % Soln   diclofenac sodium 1 % Gel Commonly known as:  VOLTAREN   famotidine 20 MG tablet Commonly known as:  PEPCID   insulin aspart 100 UNIT/ML injection Commonly known as:  novoLOG   insulin glargine 100 UNIT/ML injection Commonly known as:  LANTUS   lisinopril 20 MG tablet Commonly known as:  ZESTRIL   meclizine 25 MG tablet Commonly known as:  ANTIVERT   metFORMIN 500 MG tablet Commonly known as:  GLUCOPHAGE   rivaroxaban 20 MG Tabs tablet Commonly known as:  XARELTO   sodium chloride 0.65 % Soln nasal spray Commonly known as:  OCEAN   traMADol 50 MG tablet Commonly known as:  ULTRAM   Tylenol 325 MG tablet Generic drug:  acetaminophen     TAKE these medications   carbamide peroxide 6.5 % OTIC solution Commonly known as:  DEBROX Place 10 drops into both ears every Thursday.   DULoxetine 60 MG capsule Commonly known as:  CYMBALTA Take 60 mg by mouth daily.   ferrous sulfate 325 (65 FE) MG tablet Take 325 mg by mouth every Monday, Wednesday, and Friday.   fluticasone 50 MCG/ACT nasal spray Commonly known as:  FLONASE Place 1 spray into both nostrils daily.   gabapentin 300 MG capsule Commonly known as:  NEURONTIN Take 300 mg by mouth 3 (three) times daily.  hydrocortisone 2.5 % cream Apply 1 application topically every 12 (twelve) hours as needed (itching).   lidocaine 2 % solution Commonly known as:  XYLOCAINE Use as directed 5 mLs in the mouth or throat every 6 (six) hours as needed for mouth pain (take with Maalox).   lidocaine 5 % ointment Commonly known as:  XYLOCAINE Apply 1 application topically every 12 (twelve) hours as needed for mild pain or moderate pain.   memantine 5 MG tablet Commonly known as:  NAMENDA Take 1 tablet (5 mg total) by mouth 2 (two) times daily.   metoprolol succinate 25 MG 24 hr tablet Commonly known as:  TOPROL-XL Take 1 tablet (25 mg total) by mouth  daily. What changed:    medication strength  how much to take  additional instructions   mupirocin ointment 2 % Commonly known as:  BACTROBAN Place 1 application into the nose 2 (two) times daily.   omeprazole 40 MG capsule Commonly known as:  PRILOSEC Take 40 mg by mouth 2 (two) times a day.   ranolazine 500 MG 12 hr tablet Commonly known as:  RANEXA Take 500 mg by mouth 2 (two) times daily.   rosuvastatin 20 MG tablet Commonly known as:  CRESTOR Take 20 mg by mouth at bedtime.   triamcinolone cream 0.1 % Commonly known as:  KENALOG Apply 1 application topically every 8 (eight) hours as needed (itching).   vitamin B-12 100 MCG tablet Commonly known as:  CYANOCOBALAMIN Take 200 mcg by mouth daily.         Diet and Activity recommendation: See Discharge Instructions above   Consults obtained -nephrology, gastroenterology, intensivist.   Major procedures and Radiology Reports - PLEASE review detailed and final reports for all details, in brief -      Ct Abdomen Pelvis Wo Contrast  Result Date: 04/26/2019 CLINICAL DATA:  Abdominal distension EXAM: CT ABDOMEN AND PELVIS WITHOUT CONTRAST TECHNIQUE: Multidetector CT imaging of the abdomen and pelvis was performed following the standard protocol without IV contrast. COMPARISON:  04/25/2019 FINDINGS: Lower chest: Subsegmental atelectasis. Hepatobiliary: Small gallstone towards the neck of the gallbladder is present. Unremarkable liver. Pancreas: Atrophic.  No mass. Spleen: Unremarkable Adrenals/Urinary Tract: Exophytic small cyst from the posterior left kidney is stable. Adrenal glands are unremarkable. Right kidney is within normal limits. Bladder is decompressed by a Foley catheter. Stomach/Bowel: Stomach is distended with oral contrast. New NG tube is in place with its tip in the body of the stomach. Minimally distended small bowel loops in the lower central abdomen are noted. No clear transition point is identified.  Remainder of small bowel and colon are decompressed. There is continued wall thickening of the descending and sigmoid colon with inflammatory changes in the adjacent fat. Stranding in the adjacent fat has become more pronounced. There is no obvious pneumatosis. There is no free intraperitoneal gas. No evidence of abscess formation. There is also stranding surrounding the rectum. Rectal tube has been removed. Vascular/Lymphatic: Atherosclerotic calcifications of the aorta and iliac arteries are mild. There is no abnormal retroperitoneal adenopathy by measurement criteria. Reproductive: Normal prostate. Other: No free fluid.  Small inguinal hernias contain adipose. Musculoskeletal: Lumbosacral fusion hardware is in place with pedicle screws and stabilizing bars from L2 through S1. Neural stimulator device traverses into the posterior central spinal canal at T12-L1. The tip is at T8 and T9. No vertebral compression deformity. IMPRESSION: Inflammatory changes involving the descending colon, sigmoid colon, and rectum have become more pronounced. There is no evidence of  perforation or abscess. Differential diagnosis includes inflammatory change, infection, inflammatory bowel disease, pseudomembranous colitis, and ischemia are not excluded. NG tube tip in the stomach.  Rectal tube removed. Mild small bowel ileus pattern. Electronically Signed   By: Jolaine Click M.D.   On: 04/26/2019 07:40   Ct Abdomen Pelvis Wo Contrast  Result Date: 04/25/2019 CLINICAL DATA:  Hypotension, elevated WBC, diarrhea, abdominal pain EXAM: CT ABDOMEN AND PELVIS WITHOUT CONTRAST TECHNIQUE: Multidetector CT imaging of the abdomen and pelvis was performed following the standard protocol without IV contrast. COMPARISON:  None. FINDINGS: Lower chest: Cardiomegaly and coronary artery calcifications. Hepatobiliary: No focal liver abnormality is seen. Hepatic steatosis. Gallstones. No gallbladder wall thickening, or biliary dilatation. Pancreas:  Unremarkable. No pancreatic ductal dilatation or surrounding inflammatory changes. Spleen: Normal in size without focal abnormality. Adrenals/Urinary Tract: Adrenal glands are unremarkable. Kidneys are normal, without renal calculi, focal lesion, or hydronephrosis. Bladder is unremarkable. Stomach/Bowel: Stomach is within normal limits. Appendix appears normal. There is mild thickening of the descending colon, sigmoid colon, and rectum. The proximal colon is fluid-filled and there are dense stool balls in the rectum. Rectal tube is positioned in the rectum. Vascular/Lymphatic: Scattered calcific atherosclerosis. No enlarged abdominal or pelvic lymph nodes. Reproductive: No mass or other abnormality. Other: Small fat containing bilateral inguinal hernias. No abdominopelvic ascites. Musculoskeletal: No acute or significant osseous findings. IMPRESSION: 1. There is mild thickening of the descending colon, sigmoid colon, and rectum. This appearance suggests nonspecific infectious, inflammatory, or ischemic colitis. The proximal colon is fluid-filled and there are dense stool balls in the rectum. Rectal tube is positioned in the rectum. 2.  Other chronic and incidental findings as detailed above. Electronically Signed   By: Lauralyn Primes M.D.   On: 04/25/2019 18:38   Dg Abd 1 View  Result Date: 04/26/2019 CLINICAL DATA:  NG tube placement EXAM: ABDOMEN - 1 VIEW COMPARISON:  CT from the same day FINDINGS: The tip of the NG tube projects over the gastric body. The bowel gas pattern is nonspecific. IMPRESSION: NG tube projects over the gastric body. Electronically Signed   By: Katherine Mantle M.D.   On: 04/26/2019 03:32   Ct Head Wo Contrast  Result Date: 04/25/2019 CLINICAL DATA:  Status post unwitnessed fall, hypotensive. EXAM: CT HEAD WITHOUT CONTRAST TECHNIQUE: Contiguous axial images were obtained from the base of the skull through the vertex without intravenous contrast. COMPARISON:  Head CT dated  09/30/2014. FINDINGS: Brain: Generalized age related parenchymal volume loss with commensurate dilatation of the ventricles and sulci. Mild chronic small vessel ischemic changes within the periventricular white matter regions bilaterally. Ventricles are stable in size. No mass, hemorrhage, edema or other evidence of acute parenchymal abnormality. No extra-axial hemorrhage. Vascular: Chronic calcified atherosclerotic changes of the large vessels at the skull base. No unexpected hyperdense vessel. Skull: Normal. Negative for fracture or focal lesion. Sinuses/Orbits: No acute finding. Other: None. IMPRESSION: 1. No acute findings. No intracranial mass, hemorrhage or edema. No skull fracture. 2. Atrophy and chronic ischemic changes in the white matter. Electronically Signed   By: Bary Richard M.D.   On: 04/25/2019 15:52   Dg Chest Portable 1 View  Result Date: 04/25/2019 CLINICAL DATA:  Chest pain after fall.  Diaphoresis. EXAM: PORTABLE CHEST 1 VIEW COMPARISON:  Radiographs 05/22/2016. FINDINGS: 1514 hours. There are slightly lower lung volumes. There is stable cardiomegaly status post median sternotomy. Aortic atherosclerosis and a spinal stimulator are noted. There is vascular congestion with mild bibasilar atelectasis. No edema, confluent  airspace opacity, pleural effusion or pneumothorax. No acute osseous findings. Telemetry leads overlie the chest. IMPRESSION: No acute posttraumatic findings identified. Cardiomegaly with vascular congestion and mild bibasilar atelectasis. Electronically Signed   By: Carey BullocksWilliam  Veazey M.D.   On: 04/25/2019 15:48    Micro Results     Recent Results (from the past 240 hour(s))  SARS Coronavirus 2 (CEPHEID- Performed in [facility] hospital lab), Hosp Order     Status: None   Collection Time: 04/25/19  3:09 PM  Result Value Ref Range Status   SARS Coronavirus 2 NEGATIVE NEGATIVE Final    Comment: (NOTE) If result is NEGATIVE SARS-CoV-2 target nucleic acids are NOT  DETECTED. The SARS-CoV-2 RNA is generally detectable in upper and lower  respiratory specimens during the acute phase of infection. The lowest  concentration of SARS-CoV-2 viral copies this assay can detect is 250  copies / mL. A negative result does not preclude SARS-CoV-2 infection  and should not be used as the sole basis for treatment or other  patient management decisions.  A negative result may occur with  improper specimen collection / handling, submission of specimen other  than nasopharyngeal swab, presence of viral mutation(s) within the  areas targeted by this assay, and inadequate number of viral copies  (<250 copies / mL). A negative result must be combined with clinical  observations, patient history, and epidemiological information. If result is POSITIVE SARS-CoV-2 target nucleic acids are DETECTED. The SARS-CoV-2 RNA is generally detectable in upper and lower  respiratory specimens dur ing the acute phase of infection.  Positive  results are indicative of active infection with SARS-CoV-2.  Clinical  correlation with patient history and other diagnostic information is  necessary to determine patient infection status.  Positive results do  not rule out bacterial infection or co-infection with other viruses. If result is PRESUMPTIVE POSTIVE SARS-CoV-2 nucleic acids MAY BE PRESENT.   A presumptive positive result was obtained on the submitted specimen  and confirmed on repeat testing.  While 2019 novel coronavirus  (SARS-CoV-2) nucleic acids may be present in the submitted sample  additional confirmatory testing may be necessary for epidemiological  and / or clinical management purposes  to differentiate between  SARS-CoV-2 and other Sarbecovirus currently known to infect humans.  If clinically indicated additional testing with an alternate test  methodology [id number]) is advised. The SARS-CoV-2 RNA is generally  detectable in upper and lower respiratory sp ecimens during  the acute  phase of infection. The expected result is Negative. Fact Sheet for Patients:  BoilerBrush.com.cyhttps://www.fda.gov/media/136312/download Fact Sheet for Healthcare Providers: https://pope.com/https://www.fda.gov/media/136313/download This test is not yet approved or cleared by the [place] FDA and has been authorized for detection and/or diagnosis of SARS-CoV-2 by FDA under an Emergency Use Authorization (EUA).  This EUA will remain in effect (meaning this test can be used) for the duration of the COVID-19 declaration under Section 564(b)(1) of the Act, 21 U.S.C. section 360bbb-3(b)(1), unless the authorization is terminated or revoked sooner. Performed at [facility], [address] [place], [place] [address]   Blood Culture (routine x 2)     Status: None (Preliminary result)   Collection Time: 04/25/19  4:24 PM  Result Value Ref Range Status   Specimen Description BLOOD BLOOD RIGHT HAND  Final   Special Requests   Final    BOTTLES DRAWN AEROBIC AND ANAEROBIC Blood Culture adequate volume   Culture   Final    NO GROWTH 4 DAYS Performed at [facility],  [address] [place], [place] [address]    Report Status PENDING  Incomplete  Urine culture     Status: Abnormal   Collection Time: 04/25/19  5:09 PM  Result Value Ref Range Status   Specimen Description   Final    URINE, RANDOM Performed at [facility], [address], [place], [place] [address]    Special Requests   Final    NONE Performed at [facility], [address] [place], [place] [address]    Culture >=100,000 COLONIES/mL ENTEROCOCCUS FAECALIS (A)  Final   Report Status 04/28/2019 FINAL  Final   Organism ID, Bacteria ENTEROCOCCUS FAECALIS (A)  Final      Susceptibility   Enterococcus faecalis - MIC*    AMPICILLIN <=2 SENSITIVE Sensitive     LEVOFLOXACIN 0.5 SENSITIVE Sensitive     NITROFURANTOIN <=16 SENSITIVE Sensitive     VANCOMYCIN 1 SENSITIVE Sensitive     * >=100,000  COLONIES/mL ENTEROCOCCUS FAECALIS  C difficile quick scan w PCR reflex     Status: None   Collection Time: 04/25/19  5:09 PM  Result Value Ref Range Status   C Diff antigen NEGATIVE NEGATIVE Final   C Diff toxin NEGATIVE NEGATIVE Final   C Diff interpretation No C. difficile detected.  Final    Comment: Performed at [facility], [address] [place], [place] [address]  MRSA PCR Screening     Status: Abnormal   Collection Time: 04/25/19  8:00 PM  Result Value Ref Range Status   MRSA by PCR POSITIVE (A) NEGATIVE Final    Comment:        The GeneXpert MRSA Assay (FDA approved for NASAL specimens only), is one component of a comprehensive MRSA colonization surveillance program. It is not intended to diagnose MRSA infection nor to guide or monitor treatment for MRSA infections. RESULT CALLED TO, READ BACK BY AND VERIFIED WITH: C ADELOWO 04/25/2019 AT 2213 BY [facility] Performed at [facility], [address] [place], [place] [address]   Blood Culture (routine x 2)     Status: None (Preliminary result)   Collection Time: 04/25/19  9:07 PM  Result Value Ref Range Status   Specimen Description BLOOD BLOOD RIGHT HAND  Final   Special Requests   Final    BOTTLES DRAWN AEROBIC AND ANAEROBIC Blood Culture results may not be optimal due to an inadequate volume of blood received in culture bottles   Culture   Final    NO GROWTH 4 DAYS Performed at [facility], [address] [place], [place] [address]    Report Status PENDING  Incomplete  Gastrointestinal Panel by PCR , Stool     Status: None   Collection Time: 04/25/19  9:56 PM  Result Value Ref Range Status   Campylobacter species NOT DETECTED NOT DETECTED Final   Plesimonas shigelloides NOT DETECTED NOT DETECTED Final   Salmonella species NOT DETECTED NOT DETECTED Final   Yersinia enterocolitica NOT DETECTED NOT DETECTED Final   Vibrio species NOT DETECTED NOT DETECTED Final   Vibrio cholerae  NOT DETECTED NOT DETECTED Final   Enteroaggregative E coli (EAEC) NOT DETECTED NOT DETECTED Final   Enteropathogenic E coli (EPEC) NOT DETECTED NOT DETECTED Final   Enterotoxigenic E coli (ETEC) NOT DETECTED NOT DETECTED Final   Shiga like toxin producing E coli (STEC) NOT DETECTED NOT DETECTED Final   Shigella/Enteroinvasive E coli (EIEC) NOT DETECTED NOT DETECTED Final   Cryptosporidium NOT DETECTED NOT  DETECTED Final   Cyclospora cayetanensis NOT DETECTED NOT DETECTED Final   Entamoeba histolytica NOT DETECTED NOT DETECTED Final   Giardia lamblia NOT DETECTED NOT DETECTED Final   Adenovirus F40/41 NOT DETECTED NOT DETECTED Final   Astrovirus NOT DETECTED NOT DETECTED Final   Norovirus GI/GII NOT DETECTED NOT DETECTED Final   Rotavirus A NOT DETECTED NOT DETECTED Final   Sapovirus (I, II, IV, and V) NOT DETECTED NOT DETECTED Final    Comment: Performed at [facility], [address] [place], [place] [address]       Today   Subjective:   Patient will be transferred to [facility] when the bed is available.  Objective:   Blood pressure 140/61, pulse 75, temperature 97.8 F (36.6 C), temperature source Oral, resp. rate 20, height 6' (1.829 m), weight (!) 136.5 kg, SpO2 97 %.   Intake/Output Summary (Last 24 hours) at 04/29/2019 1532 Last data filed at 04/29/2019 1348 Gross per 24 hour  Intake 701.34 ml  Output 700 ml  Net 1.34 ml    Exam Awake Alert, Oriented x 3, No new F.N deficits, Normal affect [place].AT,PERRAL Supple Neck,No JVD, No cervical lymphadenopathy appriciated.  Symmetrical Chest wall movement, Good air movement bilaterally, CTAB RRR,No Gallops,Rubs or new Murmurs, No Parasternal Heave +ve B.Sounds, Abd Soft, Non tender, No organomegaly appriciated, No rebound -guarding or rigidity. No Cyanosis, Clubbing or edema, No new Rash or bruise  Data Review   CBC w Diff:  Lab Results  Component Value Date   WBC 8.1 04/29/2019   HGB 8.9 (L) 04/29/2019    HGB 14.4 10/29/2014   HCT 31.6 (L) 04/29/2019   HCT 43.4 10/29/2014   PLT 234 04/29/2019   PLT 185 10/29/2014   LYMPHOPCT 25 04/29/2019   LYMPHOPCT 7.5 09/30/2014   MONOPCT 10 04/29/2019   MONOPCT 7.3 09/30/2014   EOSPCT 2 04/29/2019   EOSPCT 0.1 09/30/2014   BASOPCT 0 04/29/2019   BASOPCT 0.5 09/30/2014    CMP:  Lab Results  Component Value Date   NA 141 04/29/2019   NA 141 10/29/2014   K 3.8 04/29/2019   K 3.8 10/29/2014   CL 103 04/29/2019   CL 103 10/29/2014   CO2 29 04/29/2019   CO2 30 10/29/2014   BUN 14 04/29/2019   BUN 16 10/29/2014   CREATININE 0.99 04/29/2019   CREATININE 1.07 10/29/2014   PROT 5.6 (L) 04/27/2019   PROT 6.2 (L) 10/27/2014   ALBUMIN 2.7 (L) 04/27/2019   ALBUMIN 3.1 (L) 10/27/2014   BILITOT 0.5 04/27/2019   BILITOT 0.3 10/27/2014   ALKPHOS 55 04/27/2019   ALKPHOS 74 10/27/2014   AST 24 04/27/2019   AST 11 (L) 10/27/2014   ALT 17 04/27/2019   ALT 29 10/27/2014  .   Total Time in preparing paper work, data evaluation and todays exam - 35 minutes  Katha Hamming M.D on 04/29/2019 at 3:32 PM    Note: This dictation was prepared with Dragon dictation along with smaller phrase technology. Any transcriptional errors that result from this process are unintentional.

## 2019-04-29 NOTE — Care Management Important Message (Signed)
Important Message  Patient Details  Name: Armahn Trierweiler MRN: [id number] Date of Birth: 04/30/1946   Medicare Important Message Given:  Yes    Olegario Messier A Romond Pipkins 04/29/2019, 10:57 AM

## 2019-04-29 NOTE — Progress Notes (Signed)
Called  patient's wife Ms. Xzayden Longhurst at [phone]/[phone]/2004 and explained that patient may go to [facility] when the arrangements are made. [facility] previously refused to take the patient as per Child psychotherapist as patient was in ICU on admission.

## 2019-04-29 NOTE — Progress Notes (Signed)
Notified by social worker to call Dr. Nedra Hai at [facility] at [phone],, discussed with Dr. Nedra Hai about patient's medical condition, all the clinical course from day of admission, patient now has been accepted [facility], accepting physician is Dr. Christell Constant

## 2019-04-29 NOTE — TOC Progression Note (Signed)
Transition of Care [facility]) - Progression Note    Patient Details  Name: Thomas Watkins MRN: [id number] Date of Birth: 1946/06/06  [facility]) CM/SW Contact  Barrie Dunker, RN Phone Number: 04/29/2019, 1:46 PM  Clinical Narrative:     [facility] called and requested a new progress note with the name and contact of the Attending, I faxed the latest progress note with Dr. Luberta Mutter contact information to [phone]  Expected Discharge Plan: Home w Home Health Services(From [facility]) Barriers to Discharge: Continued Medical Work up  Expected Discharge Plan and Services Expected Discharge Plan: Home w Home Health Services(From [facility])       Living arrangements for the past 2 months: Assisted Living Facility                                       Social Determinants of Health (SDOH) Interventions    Readmission Risk Interventions No flowsheet data found.

## 2019-04-29 NOTE — Progress Notes (Signed)
Pt has had 3 small mucosy  Stools. Today.  incont at times of urine also. Chair today with 2 assists and walker. tol well.  Started clear liq diet.

## 2019-04-29 NOTE — Progress Notes (Signed)
Made patient aware that he will be transferred to [facility]. 6A. Called report to Lela Hartso. Answered all questions asked. Will be calling [facility] to set up transport.

## 2019-04-29 NOTE — Progress Notes (Signed)
[employer] - [place] at [facility]   PATIENT NAME: Thomas Watkins    MR#:  [id number]  DATE OF BIRTH:  Dec 07, 1945  SUBJECTIVE: Patient says that he is feeling better, has some pain about the bladder but otherwise denies any other complaints.  CHIEF COMPLAINT:   Chief Complaint  Patient presents with  . Fall  Patient is doing well today, no diarrhea, good urine output .   REVIEW OF SYSTEMS:  Review of Systems  Constitutional: Negative for diaphoresis, fever, malaise/fatigue and weight loss.  HENT: Negative for ear discharge, ear pain, hearing loss, nosebleeds, sore throat and tinnitus.   Eyes: Negative for blurred vision and pain.  Respiratory: Negative for cough, hemoptysis, shortness of breath and wheezing.   Cardiovascular: Negative for chest pain, palpitations, orthopnea and leg swelling.  Gastrointestinal: Negative for abdominal pain, blood in stool, constipation, diarrhea, heartburn, nausea and vomiting.  Genitourinary: Negative for dysuria, frequency and urgency.  Musculoskeletal: Negative for back pain and myalgias.  Skin: Negative for itching and rash.  Neurological: Negative for dizziness, tingling, tremors, focal weakness, seizures, weakness and headaches.  Psychiatric/Behavioral: Negative for depression. The patient is not nervous/anxious.    DRUG ALLERGIES:   Allergies  Allergen Reactions  . Atorvastatin Other (See Comments)    Joint swelling  . Duloxetine Hcl Rash  . Carbamazepine   . Lecithin   . Oxcarbazepine   . Tizanidine Hcl   . Yellow Dyes (Non-Tartrazine)     Yellow dye #5 specifically   VITALS:  Blood pressure 140/61, pulse 75, temperature 97.8 F (36.6 C), temperature source Oral, resp. rate 20, height 6' (1.829 m), weight (!) 136.5 kg, SpO2 97 %. PHYSICAL EXAMINATION:  Physical Exam HENT:     Head: Normocephalic and atraumatic.  Eyes:     Conjunctiva/sclera: Conjunctivae normal.     Pupils: Pupils are equal, round, and  reactive to light.  Neck:     Musculoskeletal: Normal range of motion and neck supple.     Thyroid: No thyromegaly.     Trachea: No tracheal deviation.  Cardiovascular:     Rate and Rhythm: Normal rate and regular rhythm.     Heart sounds: Normal heart sounds.  Pulmonary:     Effort: Pulmonary effort is normal. No respiratory distress.     Breath sounds: Normal breath sounds. No wheezing.  Chest:     Chest wall: No tenderness.  Abdominal:     General: Bowel sounds are normal. There is no distension.     Palpations: Abdomen is soft.     Tenderness: There is no abdominal tenderness.     Comments: Patient has minimal amount of rectal bleed on palpation of abdomen.  Musculoskeletal: Normal range of motion.  Skin:    General: Skin is warm and dry.     Findings: No rash.  Neurological:     Mental Status: He is alert and oriented to person, place, and time.     Cranial Nerves: No cranial nerve deficit.    LABORATORY PANEL:  Male CBC Recent Labs  Lab 04/29/19 0554  WBC 8.1  HGB 8.9*  HCT 31.6*  PLT 234   ------------------------------------------------------------------------------------------------------------------ Chemistries  Recent Labs  Lab 04/26/19 0131  04/27/19 0331  04/29/19 0554  NA 140   < > 136   < > 141  K 6.3*   < > 3.6   < > 3.8  CL 107   < > 99   < > 103  CO2 15*   < >  27   < > 29  GLUCOSE 145*   < > 177*   < > 136*  BUN 23   < > 29*   < > 14  CREATININE 2.36*   < > 2.06*   < > 0.99  CALCIUM 8.2*   < > 7.3*   < > 8.1*  MG 2.3  --   --   --   --   AST 40  --  24  --   --   ALT 21  --  17  --   --   ALKPHOS 74  --  55  --   --   BILITOT 0.6  --  0.5  --   --    < > = values in this interval not displayed.   RADIOLOGY:  No results found. ASSESSMENT AND PLAN:  73 y.o. male with a known history of obesity, dementia, diabetes, history of coronary disease status post bypass, obstructive sleep apnea, chronic diastolic dysfunction who presents to the  hospital from a assisted living secondary to fall and noted to be significantly hypotensive with systolic blood pressures in the 80s.  1.   septic shock present on admission secondary to UTI, urine cultures are showing Enterococcus faecalis, had elevated lactic acid, procalcitonin, antibiotics were changed to ampicillin.  2.  Acute blood loss anemia secondary to rectal bleed, resolving, hemoglobin stable, GI on the case, Plavix on hold, today is day 5 without Plavix, possible ischemic colitis, transfuse as needed, possible ulcerative colitis exacerbation, patient may need flexible sigmoidoscopy.   3.  Leukocytosis-secondary to colitis? Improved.  4.  Diabetes type 2 with neuropathy- stable with sliding scale insulin with coverage.  5.  Diabetic neuropathy- clear liquids today.  6.  History of atrial fibrillation-rate controlled.  -Hold Xarelto due to GI bleed, restart beta-blocker at a lower dose. 7.  History of coronary disease-patient has no acute chest pain. - hold Plavix due to Heme + stools.  Today is day 5 of holding the Plavix.   8. Hyperlipidemia - cont. Crestor.   9. Essential HTN -hypotension before but now improved.  Still soft so continue to hold BP medicines for now.   10. Acute renal failure with hyperkalemia and metabolic acidosis.  Improved,  Acute renal failure from current illness. Lactic acidosis + - hold lisinopril -Function improving.-Nephrology following  11. Acute blood loss anemia: with GI bleed.  Appreciate GI and surgery input.  Start clear liquids today, discussed with gastroenterologist Dr. Wyline MoodKiran Anna   All the records are reviewed and case discussed with Care Management/Social Worker. Management plans discussed with the patient, intensivist and they are in agreement.  CODE STATUS: DNR  TOTAL TIME TAKING CARE OF THIS PATIENT: 35 min  More than 50% of the time was spent in counseling/coordination of care: YES  POSSIBLE D/C IN 3-4 DAYS,  DEPENDING ON CLINICAL CONDITION.   Katha HammingSnehalatha Shivangi Lutz M.D on 04/29/2019 at 10:49 AM  Between 7am to 6pm - Pager - [phone]  After 6pm go to [email or web address] - Social research officer, governmentpassword [email or web address] [email or web address]  [facility] [place] [facility]  Office  [phone]  CC: Primary care physician; [facility]  Note: This dictation was prepared with Dragon dictation along with smaller phrase technology. Any transcriptional errors that result from this process are unintentional.

## 2019-04-29 NOTE — TOC Progression Note (Signed)
Transition of Care [facility]) - Progression Note    Patient Details  Name: Allison Corum MRN: [id number] Date of Birth: 10-13-1946  Transition of Care [facility]) CM/SW Contact  Barrie Dunker, RN Phone Number: 04/29/2019, 3:02 PM  Clinical Narrative:    Received a call from Dr. Alan Mulder from the [facility] and he asked that Dr Luberta Mutter call him at [phone], I relayed the message to Dr Luberta Mutter   Expected Discharge Plan: Home w Home Health Services(From [facility]) Barriers to Discharge: Continued Medical Work up  Expected Discharge Plan and Services Expected Discharge Plan: Home w Home Health Services(From [facility])       Living arrangements for the past 2 months: Assisted Living Facility                                       Social Determinants of Health (SDOH) Interventions    Readmission Risk Interventions No flowsheet data found.

## 2019-04-30 LAB — CULTURE, BLOOD (ROUTINE X 2)
Culture: NO GROWTH
Culture: NO GROWTH
Special Requests: ADEQUATE

## 2019-04-30 NOTE — Progress Notes (Signed)
Pt transferred to [facility] @ 0045. Spouse Vernona Rieger was informed @ 0125 that transfer was completed. No signs of distress at the time of transfer.

## 2019-08-30 ENCOUNTER — Ambulatory Visit (LOCAL_COMMUNITY_HEALTH_CENTER): Payer: Medicare Other

## 2019-08-30 ENCOUNTER — Other Ambulatory Visit: Payer: Self-pay

## 2019-08-30 DIAGNOSIS — Z111 Encounter for screening for respiratory tuberculosis: Secondary | ICD-10-CM

## 2019-09-02 ENCOUNTER — Ambulatory Visit (LOCAL_COMMUNITY_HEALTH_CENTER): Payer: Self-pay

## 2019-09-02 ENCOUNTER — Other Ambulatory Visit: Payer: Self-pay

## 2019-09-02 DIAGNOSIS — Z111 Encounter for screening for respiratory tuberculosis: Secondary | ICD-10-CM

## 2019-09-02 LAB — TB SKIN TEST
Induration: 0 mm
TB Skin Test: NEGATIVE

## 2021-05-14 IMAGING — DX ABDOMEN - 1 VIEW
1 series · 1 of 1 positions shown · non-contrast
Comparison: CT from the same day

CLINICAL DATA: NG tube placement

EXAM:
ABDOMEN - 1 VIEW

[abdomen supine]
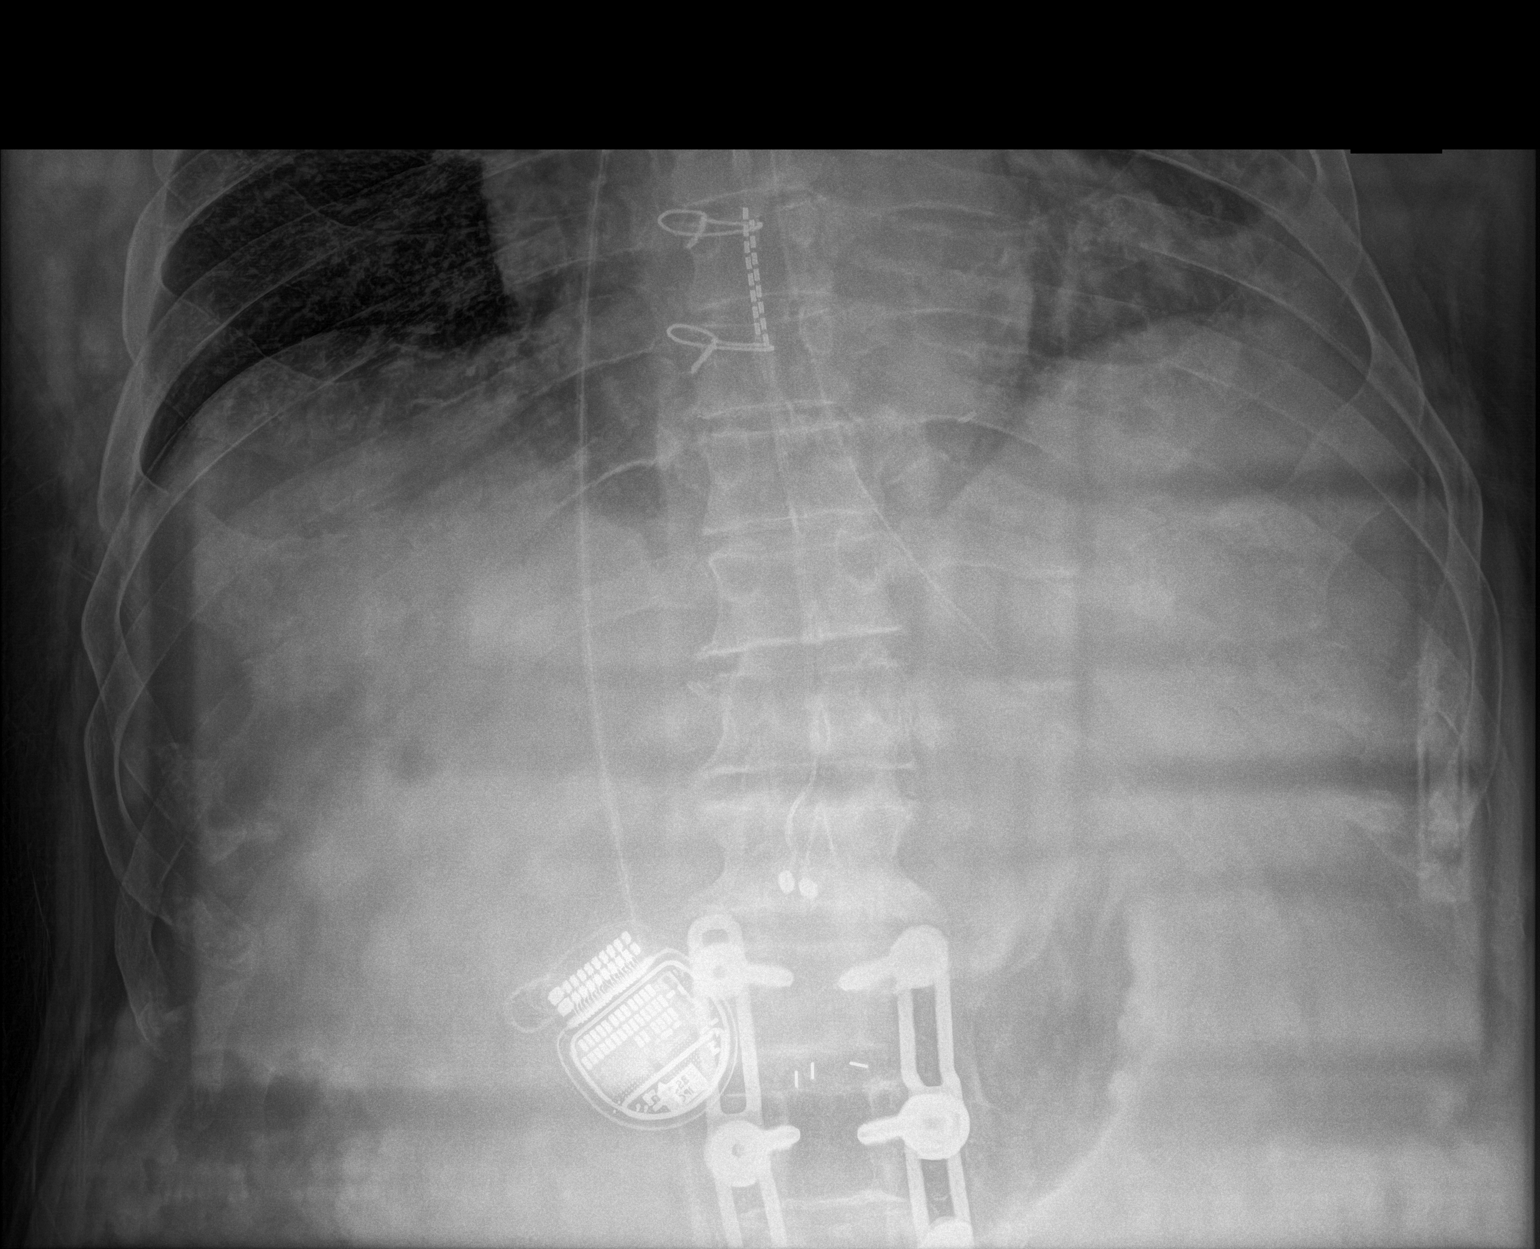

[1 of 1 positions shown; findings below may reference images not displayed]

FINDINGS: The tip of the NG tube projects over the gastric body. The bowel gas
pattern is nonspecific.
IMPRESSION: NG tube projects over the gastric body.
